# Patient Record
Sex: Male | Born: 1980 | Race: White | Hispanic: No | Marital: Married | Smoking: Former smoker
Health system: Southern US, Community
[De-identification: ages and names within clinical notes are randomized; demographics above are authoritative.]

## PROBLEM LIST (undated history)

## (undated) DIAGNOSIS — K219 Gastro-esophageal reflux disease without esophagitis: Secondary | ICD-10-CM

## (undated) DIAGNOSIS — Z87442 Personal history of urinary calculi: Secondary | ICD-10-CM

## (undated) DIAGNOSIS — F419 Anxiety disorder, unspecified: Secondary | ICD-10-CM

## (undated) DIAGNOSIS — I10 Essential (primary) hypertension: Secondary | ICD-10-CM

## (undated) DIAGNOSIS — M1990 Unspecified osteoarthritis, unspecified site: Secondary | ICD-10-CM

## (undated) DIAGNOSIS — N209 Urinary calculus, unspecified: Secondary | ICD-10-CM

## (undated) DIAGNOSIS — M797 Fibromyalgia: Secondary | ICD-10-CM

## (undated) HISTORY — PX: ROTATOR CUFF REPAIR: SHX139

## (undated) HISTORY — PX: ADENOIDECTOMY: SUR15

## (undated) HISTORY — PX: TONSILLECTOMY: SUR1361

## (undated) HISTORY — PX: SPINAL CORD STIMULATOR IMPLANT: SHX2422

## (undated) HISTORY — PX: BACK SURGERY: SHX140

## (undated) HISTORY — PX: WISDOM TOOTH EXTRACTION: SHX21

---
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
## 2005-07-23 HISTORY — PX: URETEROSCOPY: SHX842

## 2009-07-23 HISTORY — PX: CARPAL TUNNEL RELEASE: SHX101

## 2011-11-14 ENCOUNTER — Other Ambulatory Visit (HOSPITAL_COMMUNITY): Payer: Self-pay | Admitting: Otolaryngology

## 2011-11-15 ENCOUNTER — Encounter (HOSPITAL_COMMUNITY): Payer: Self-pay | Admitting: Pharmacy Technician

## 2011-11-20 ENCOUNTER — Encounter (HOSPITAL_COMMUNITY)
Admission: RE | Admit: 2011-11-20 | Discharge: 2011-11-20 | Disposition: A | Discharge status: Home / Self Care | Payer: Medicaid Other | Source: Ambulatory Visit | Attending: Otolaryngology | Admitting: Otolaryngology

## 2011-11-20 ENCOUNTER — Encounter (HOSPITAL_COMMUNITY): Payer: Self-pay

## 2011-11-20 ENCOUNTER — Encounter (HOSPITAL_COMMUNITY)
Admission: RE | Admit: 2011-11-20 | Discharge: 2011-11-20 | Disposition: A | Discharge status: Home / Self Care | Payer: Medicaid Other | Source: Ambulatory Visit | Attending: Anesthesiology | Admitting: Anesthesiology

## 2011-11-20 HISTORY — DX: Urinary calculus, unspecified: N20.9

## 2011-11-20 HISTORY — DX: Anxiety disorder, unspecified: F41.9

## 2011-11-20 HISTORY — DX: Unspecified osteoarthritis, unspecified site: M19.90

## 2011-11-20 HISTORY — DX: Gastro-esophageal reflux disease without esophagitis: K21.9

## 2011-11-20 LAB — CBC
HCT: 43.7 % (ref 39.0–52.0)
MCHC: 34.3 g/dL (ref 30.0–36.0)
MCV: 96.7 fL (ref 78.0–100.0)
RDW: 12.5 % (ref 11.5–15.5)

## 2011-11-20 LAB — BASIC METABOLIC PANEL
BUN: 16 mg/dL (ref 6–23)
CO2: 25 mEq/L (ref 19–32)
Calcium: 9.1 mg/dL (ref 8.4–10.5)
Chloride: 101 mEq/L (ref 96–112)
Creatinine, Ser: 0.81 mg/dL (ref 0.50–1.35)

## 2011-11-20 NOTE — Pre-Procedure Instructions (Addendum)
20 REX OESTERLE  11/20/2011   Your procedure is scheduled on:  11/21/11  Report to [facility] a  930* AM.  Call this number if you have problems the morning of surgery: [phone]   Remember:   Do not eat food:After Midnight.  May have clear liquids: up to 4 Hours before arrival.530 am  Clear liquids include soda, tea, black coffee, apple or grape juice, broth.  Take these medicines the morning of surgery with A SIP OF WATER: pain med, allegra  No aspirin today  NO INSULIN, ASPIRIN AM OF SURGERY   Do not wear jewelry, make-up or nail polish.  Do not wear lotions, powders, or perfumes. You may wear deodorant.  Do not shave 48 hours prior to surgery.  Do not bring valuables to the hospital.  Contacts, dentures or bridgework may not be worn into surgery.  Leave suitcase in the car. After surgery it may be brought to your room.  For patients admitted to the hospital, checkout time is 11:00 AM the day of discharge.   Patients discharged the day of surgery will not be allowed to drive home.  Name and phone number of your driver:melissa [phone]  Special Instructions: CHG Shower Use Special Wash: 1/2 bottle night before surgery and 1/2 bottle morning of surgery.   Please read over the following fact sheets that you were given: Pain Booklet, Coughing and Deep Breathing and Surgical Site Infection Prevention

## 2011-11-21 ENCOUNTER — Ambulatory Visit (HOSPITAL_COMMUNITY)
Admission: RE | Admit: 2011-11-21 | Discharge: 2011-11-21 | Disposition: A | Discharge status: Home / Self Care | Payer: Medicaid Other | Source: Ambulatory Visit | Attending: Otolaryngology | Admitting: Otolaryngology

## 2011-11-21 ENCOUNTER — Ambulatory Visit (HOSPITAL_COMMUNITY): Payer: Medicaid Other | Admitting: Certified Registered Nurse Anesthetist

## 2011-11-21 ENCOUNTER — Encounter (HOSPITAL_COMMUNITY): Payer: Self-pay | Admitting: Certified Registered Nurse Anesthetist

## 2011-11-21 ENCOUNTER — Encounter (HOSPITAL_COMMUNITY)
Admission: RE | Disposition: A | Discharge status: Home / Self Care | Payer: Self-pay | Source: Ambulatory Visit | Attending: Otolaryngology

## 2011-11-21 ENCOUNTER — Encounter (HOSPITAL_COMMUNITY): Payer: Self-pay | Admitting: *Deleted

## 2011-11-21 DIAGNOSIS — Z01812 Encounter for preprocedural laboratory examination: Secondary | ICD-10-CM | POA: Insufficient documentation

## 2011-11-21 DIAGNOSIS — E119 Type 2 diabetes mellitus without complications: Secondary | ICD-10-CM | POA: Insufficient documentation

## 2011-11-21 DIAGNOSIS — Z5309 Procedure and treatment not carried out because of other contraindication: Secondary | ICD-10-CM | POA: Insufficient documentation

## 2011-11-21 DIAGNOSIS — Z9641 Presence of insulin pump (external) (internal): Secondary | ICD-10-CM | POA: Insufficient documentation

## 2011-11-21 DIAGNOSIS — J45909 Unspecified asthma, uncomplicated: Secondary | ICD-10-CM | POA: Insufficient documentation

## 2011-11-21 DIAGNOSIS — K219 Gastro-esophageal reflux disease without esophagitis: Secondary | ICD-10-CM | POA: Insufficient documentation

## 2011-11-21 DIAGNOSIS — F411 Generalized anxiety disorder: Secondary | ICD-10-CM | POA: Insufficient documentation

## 2011-11-21 DIAGNOSIS — J383 Other diseases of vocal cords: Secondary | ICD-10-CM | POA: Insufficient documentation

## 2011-11-21 DIAGNOSIS — Z794 Long term (current) use of insulin: Secondary | ICD-10-CM | POA: Insufficient documentation

## 2011-11-21 HISTORY — PX: DIRECT LARYNGOSCOPY: SHX5326

## 2011-11-21 LAB — GLUCOSE, CAPILLARY: Glucose-Capillary: 209 mg/dL — ABNORMAL HIGH (ref 70–99)

## 2011-11-21 SURGERY — LARYNGOSCOPY, DIRECT
Anesthesia: General | Site: Mouth | Laterality: Right | Wound class: Clean Contaminated

## 2011-11-21 MED ORDER — CEFAZOLIN SODIUM-DEXTROSE 2-3 GM-% IV SOLR
2.0000 g | INTRAVENOUS | Status: AC
  Administered 2011-11-21: 2 g via INTRAVENOUS
  Filled 2011-11-21: qty 50

## 2011-11-21 MED ORDER — SUCCINYLCHOLINE CHLORIDE 20 MG/ML IJ SOLN
INTRAMUSCULAR | Status: DC | PRN
  Administered 2011-11-21: 100 mg via INTRAVENOUS

## 2011-11-21 MED ORDER — DROPERIDOL 2.5 MG/ML IJ SOLN: 0.6250 mg | INTRAMUSCULAR | Status: DC | PRN

## 2011-11-21 MED ORDER — ONDANSETRON HCL 4 MG/2ML IJ SOLN
INTRAMUSCULAR | Status: DC | PRN
  Administered 2011-11-21: 4 mg via INTRAVENOUS

## 2011-11-21 MED ORDER — LIDOCAINE HCL (CARDIAC) 20 MG/ML IV SOLN
INTRAVENOUS | Status: DC | PRN
  Administered 2011-11-21: 20 mg via INTRAVENOUS

## 2011-11-21 MED ORDER — PROPOFOL 10 MG/ML IV EMUL
INTRAVENOUS | Status: DC | PRN
  Administered 2011-11-21: 200 mg via INTRAVENOUS
  Administered 2011-11-21: 40 mg via INTRAVENOUS

## 2011-11-21 MED ORDER — HYDROMORPHONE HCL PF 1 MG/ML IJ SOLN
0.2500 mg | INTRAMUSCULAR | Status: DC | PRN
  Administered 2011-11-21: 0.5 mg via INTRAVENOUS
  Administered 2011-11-21 (×2): 0.25 mg via INTRAVENOUS

## 2011-11-21 MED ORDER — LACTATED RINGERS IV SOLN
INTRAVENOUS | Status: DC
  Administered 2011-11-21: 11:00:00 via INTRAVENOUS

## 2011-11-21 MED ORDER — LACTATED RINGERS IV SOLN
INTRAVENOUS | Status: DC | PRN
  Administered 2011-11-21: 11:00:00 via INTRAVENOUS

## 2011-11-21 MED ORDER — DEXAMETHASONE SODIUM PHOSPHATE 4 MG/ML IJ SOLN
INTRAMUSCULAR | Status: DC | PRN
  Administered 2011-11-21: 4 mg via INTRAVENOUS

## 2011-11-21 MED ORDER — MIDAZOLAM HCL 5 MG/5ML IJ SOLN
INTRAMUSCULAR | Status: DC | PRN
  Administered 2011-11-21: 2 mg via INTRAVENOUS

## 2011-11-21 MED ORDER — FENTANYL CITRATE 0.05 MG/ML IJ SOLN
INTRAMUSCULAR | Status: DC | PRN
  Administered 2011-11-21: 50 ug via INTRAVENOUS
  Administered 2011-11-21: 100 ug via INTRAVENOUS

## 2011-11-21 SURGICAL SUPPLY — 20 items
BALLN PULM 15 16.5 18X75 (BALLOONS)
BALLOON PULM 15 16.5 18X75 (BALLOONS) IMPLANT
CANISTER SUCTION 2500CC (MISCELLANEOUS) ×2 IMPLANT
CONT SPEC 4OZ CLIKSEAL STRL BL (MISCELLANEOUS) IMPLANT
COVER TABLE BACK 60X90 (DRAPES) ×2 IMPLANT
DRAPE PROXIMA HALF (DRAPES) ×2 IMPLANT
GLOVE SURG SS PI 7.5 STRL IVOR (GLOVE) ×2 IMPLANT
GOWN STRL NON-REIN LRG LVL3 (GOWN DISPOSABLE) IMPLANT
GUARD TEETH (MISCELLANEOUS) ×2 IMPLANT
MARKER SKIN DUAL TIP RULER LAB (MISCELLANEOUS) ×2 IMPLANT
NS IRRIG 1000ML POUR BTL (IV SOLUTION) ×2 IMPLANT
PAD ARMBOARD 7.5X6 YLW CONV (MISCELLANEOUS) ×4 IMPLANT
PATTIES SURGICAL .5 X1 (DISPOSABLE) IMPLANT
SOLUTION ANTI FOG 6CC (MISCELLANEOUS) ×2 IMPLANT
SPONGE GAUZE 4X4 12PLY (GAUZE/BANDAGES/DRESSINGS) ×2 IMPLANT
SURGILUBE 2OZ TUBE FLIPTOP (MISCELLANEOUS) IMPLANT
SYR INFLATE BILIARY GAUGE (MISCELLANEOUS) IMPLANT
TOWEL OR 17X24 6PK STRL BLUE (TOWEL DISPOSABLE) ×4 IMPLANT
TUBE CONNECTING 12X1/4 (SUCTIONS) ×2 IMPLANT
WATER STERILE IRR 1000ML POUR (IV SOLUTION) ×2 IMPLANT

## 2011-11-21 NOTE — Anesthesia Preprocedure Evaluation (Signed)
Anesthesia Evaluation  Patient identified by MRN, date of birth, ID band Patient awake    Reviewed: Allergy & Precautions, H&P , NPO status , Patient's Chart, lab work & pertinent test results  History of Anesthesia Complications Negative for: history of anesthetic complications  Airway Mallampati: II TM Distance: >3 FB Neck ROM: Full    Dental  (+) Teeth Intact and Dental Advisory Given   Pulmonary asthma ,  breath sounds clear to auscultation  Pulmonary exam normal       Cardiovascular Rhythm:Regular Rate:Normal     Neuro/Psych Anxiety    GI/Hepatic Neg liver ROS, GERD-  ,  Endo/Other  Diabetes mellitus-, Type 1, Insulin Dependent  Renal/GU negative Renal ROS     Musculoskeletal   Abdominal   Peds  Hematology   Anesthesia Other Findings   Reproductive/Obstetrics                           Anesthesia Physical Anesthesia Plan  ASA: III  Anesthesia Plan: General   Post-op Pain Management:    Induction: Intravenous  Airway Management Planned: Oral ETT  Additional Equipment:   Intra-op Plan:   Post-operative Plan:   Informed Consent: I have reviewed the patients History and Physical, chart, labs and discussed the procedure including the risks, benefits and alternatives for the proposed anesthesia with the patient or authorized representative who has indicated his/her understanding and acceptance.   Dental advisory given  Plan Discussed with: CRNA, Anesthesiologist and Surgeon  Anesthesia Plan Comments:         Anesthesia Quick Evaluation

## 2011-11-21 NOTE — Op Note (Signed)
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 After discussing the risks, benefits, and alternatives he was taken today for planned direct laryngoscopy with biopsy of the lesion. DESCRIPTION OF OPERATION: The patient was brought to the operating room and was placed in the supine position and was placed under general endotracheal anesthesia by anesthesiology. This required two attempts and a significant amount of cricoid pressure due to a deep/anterior larynx and he was a grade II view with cricoid pressure.  Direct laryngoscopy was performed first using the Dedo laryngoscope. I placed a tooth guard to protect the maxillary dentition. He was a grade II/III view with only the arytenoids visible and I could not identify the anterior commissure or the glottic aperture. I then attempted direct laryngoscopy using the anterior commissure laryngoscope. He was again a grade II/III view with only the arytenoids identified and I could not identify the anterior commissure or the glottic aperture even with the anterior commissure scope and zero degree hopkins rod. As the right anterior commissure lesion could not be visualized and could not be biopsied, the case was aborted.  The patient was turned back to anesthesia and awakened from anesthesia and extubated without difficulty. The patient tolerated the procedure well with no immediate complications and was taken to the postoperative recovery area in good condition. We will plan on a referral to Laryngology  at [facility] for possible laryngoscopy with biopsy.   Dr. Melvenia Beam was present and performed the entire procedure. 11/21/11 12:17 PM Melvenia Beam

## 2011-11-21 NOTE — Discharge Instructions (Signed)
Laryngoscopy A laryngoscopy is a procedure performed to view the back of the throat, vocal cords, and voice box (larynx). It may be done in order to figure out why a person has:  A cough.   Voice changes, such as new hoarseness.   Throat pain.   Problems swallowing.  During a laryngoscopy, tissue samples (biopsies) can be taken or foreign bodies can be removed. A laryngoscopy can be done in the caregiver's office. It may be performed with a hand-held mirror, or it may require the use of a fiberoptic scope. Under some circumstances, it may be done in a hospital with medicine to help you sleep (general anesthesia). LET YOUR CAREGIVER KNOW ABOUT:   Allergies.   Medicines taken, including herbs, eyedrops, over-the-counter medicines, and creams.   Use of steroids (by mouth or creams).   Previous problems with anesthetics or numbing medicines.   History of bleeding or blood problems.   History of blood clots.   Possibility of pregnancy, if this applies.   Previous surgery.   Other health problems.  RISKS AND COMPLICATIONS   Pain.   Gagging.   Vomiting.   Swelling.   Bleeding.   Problems from anesthesia.  BEFORE THE PROCEDURE  Several days before the procedure, you may have blood tests to make sure the blood clots normally. You may be asked to stop taking blood thinners, aspirin, and/or nonsteroidal anti-inflammatory drugs (NSAIDs) before the procedure. Do not give aspirin to children. If general anesthesia is going to be used, you will usually be asked to stop eating and drinking at least 8 hours before the procedure. Have someone go with you to the procedure in order to drive you home afterwards. PROCEDURE  If you are having the procedure in the caregiver's office, it is usually performed while sitting up in a special exam chair with a headrest. A numbing medicine will be sprayed into the mouth and on the back of the throat. Your caregiver will use a gauze pad to hold the  tongue out of the way. A mirror will be held at the back of the throat to allow your caregiver to see down the throat. You may be asked to make certain sounds so that vocal cord movement can be observed. If a fiberoptic scope is being used, it will be inserted into either the nose or the mouth and slipped into the throat. Your caregiver can look through an eyepiece or can see an image projected on a monitor. Pieces of tissue can be taken (biopsies) or foreign bodies removed. If you need to have the procedure performed under general anesthesia, you will be lying down on a special operating table. The same kinds of procedures will be followed, but you will not be aware of them. AFTER THE PROCEDURE   When laryngoscopy is done with only local numbing, it usually does not require any changes to your activity level after the procedure is complete.   You may have a sore throat.   You may be asked to rest the voice for some length of time after the procedure.   Do not smoke.   Follow your caregiver's directions regarding eating and drinking after the procedure.   If you had a biopsy taken, your caregiver may advise trying to avoid coughing, whispering, or clearing the throat.   If you were given a general anesthetic or a medicine to help you relax (sedative), you will be sleepy.   There may be some pain or you may feel sick   to your stomach (nauseous), but this can usually be controlled with medicines taken by mouth.   You will stay in the recovery room until awake and able to drink fluids.   You can go back to his or her usual level of activity within several days.  HOME CARE INSTRUCTIONS   Take all medicines exactly as directed.   Follow any prescribed diet.   Follow instructions regarding rest (including voice rest) and physical activity.   Follow instructions for the use of throat lozenges or gargles.  SEEK IMMEDIATE MEDICAL CARE IF:   You have severe pain.   You have new bleeding  during coughing, spitting, or vomiting.   You develop nausea and vomiting.   You develop new problems with swallowing.   You have difficulty breathing or have shortness of breath.   You have chest pain.   Your voice changes.   You have a fever.   You develop a cough.  MAKE SURE YOU:   Understand these instructions.   Will watch your condition.   Will get help right away if you are not doing well or get worse.  Document Released: 10/03/2009 Document Revised: 06/28/2011 Document Reviewed: 10/03/2009 ExitCare Patient Information 2012 [employer]. 

## 2011-11-21 NOTE — Preoperative (Signed)
Beta Blockers   Reason not to administer Beta Blockers:Not Applicable 

## 2011-11-21 NOTE — Progress Notes (Addendum)
Dr singer notified [facility] 304.pt bolused 5 units via insulin pump per dr singer.pump set at basal rate.

## 2011-11-21 NOTE — Transfer of Care (Signed)
Immediate Anesthesia Transfer of Care Note  Patient: Donald Wall  Procedure(s) Performed: Procedure(s) (LRB): DIRECT LARYNGOSCOPY (Right)  Patient Location: PACU  Anesthesia Type: General  Level of Consciousness: awake, alert , oriented and patient cooperative  Airway & Oxygen Therapy: Patient Spontanous Breathing and Patient connected to nasal cannula oxygen  Post-op Assessment: Report given to PACU RN  Post vital signs: Reviewed and stable  Complications: No apparent anesthesia complications

## 2011-11-21 NOTE — H&P (Signed)
11/21/11  Donald Wall  PREOPERATIVE HISTORY AND PHYSICAL  CHIEF COMPLAINT: right vocal fold mass  HISTORY: This is a 31 year old who presents with _hoarseness and a right vocal fold mass.  He now presents for direct laryngoscopy with biopsy.  Dr. Emeline Darling, [place] has discussed the risks, benefits, and alternatives of this procedure. The patient understands the risks and would like to proceed with the procedure. The chances of success of the procedure are >50% and the patient understands this. I personally performed an examination of the patient within 24 hours of the procedure.  PAST MEDICAL HISTORY: Past Medical History  Diagnosis Date  . Asthma     problem for one year  09  . Stones in the urinary tract   . GERD (gastroesophageal reflux disease)     no med  . Arthritis     rheumatoid  . Anxiety   . Diabetes mellitus     insulin pump    PAST SURGICAL HISTORY: Past Surgical History  Procedure Date  . Tonsillectomy   . Ureteroscopy 07    x2 08 with litho  . Carpal tunnel release 11    rt    MEDICATIONS: No current facility-administered medications on file prior to encounter.   Current Outpatient Prescriptions on File Prior to Encounter  Medication Sig Dispense Refill  . atorvastatin (LIPITOR) 40 MG tablet Take 40 mg by mouth daily.      Marland Kitchen etanercept (ENBREL) 50 MG/ML injection Inject 50 mg into the skin once a week.      . fexofenadine (ALLEGRA) 180 MG tablet Take 180 mg by mouth daily.      . furosemide (LASIX) 20 MG tablet Take 20 mg by mouth daily.      . Insulin Human (INSULIN PUMP) 100 unit/ml SOLN Inject into the skin continuous. novolog insulin      . lisinopril (PRINIVIL,ZESTRIL) 20 MG tablet Take 20 mg by mouth daily.      . niacin (NIASPAN) 1000 MG CR tablet Take 2,000 mg by mouth daily.        ALLERGIES: No Known Allergies  SOCIAL HISTORY: History   Social History  . Marital Status: Married    Spouse Name: N/A    Number of Children: N/A  . Years of  Education: N/A   Occupational History  . Not on file.   Social History Main Topics  . Smoking status: Former Smoker -- 1.5 packs/day for 4 years    Types: Cigarettes    Quit date: 10/20/2010  . Smokeless tobacco: Not on file  . Alcohol Use: 4.2 oz/week    7 Shots of liquor per week  . Drug Use: No  . Sexually Active: Yes   Other Topics Concern  . Not on file   Social History Narrative  . No narrative on file    FAMILY HISTORY:History reviewed. No pertinent family history.  REVIEW OF SYSTEMS: + hoarseness, otherwise negative x 10 systems except per HPI   PHYSICAL EXAM:  GENERAL:  NAD VITAL SIGNS:   Filed Vitals:   11/21/11 0925  BP: 119/85  Pulse: 73  Temp: 98.5 F (36.9 C)  Resp: 18   SKIN:  Warm, dry HEENT:  Hoarse voice, oral cavity clear NECK:  supple LYMPH:  No LAD LUNGS:  clear CARDIOVASCULAR:  RRR ABDOMEN:  soft MUSCULOSKELETAL: normal strength PSYCH:  Awake, alert NEUROLOGIC:  CN 2-12 intact and symmetric  DIAGNOSTIC STUDIES: none  ASSESSMENT AND PLAN: Plan to proceed with direct laryngoscopy with biopsy of  the right vocal fold mass. Patient understands the risks, benefits, and alternatives.  11/21/11 10:32 AM Donald Wall

## 2011-11-21 NOTE — Anesthesia Postprocedure Evaluation (Signed)
  Anesthesia Post-op Note  Patient: Donald Wall  Procedure(s) Performed: Procedure(s) (LRB): DIRECT LARYNGOSCOPY (Right)  Patient Location: PACU  Anesthesia Type: General  Level of Consciousness: awake, alert  and oriented  Airway and Oxygen Therapy: Patient Spontanous Breathing  Post-op Pain: mild  Post-op Assessment: Post-op Vital signs reviewed, Patient's Cardiovascular Status Stable, Respiratory Function Stable, Patent Airway, No signs of Nausea or vomiting and Pain level controlled  Post-op Vital Signs: Reviewed and stable  Complications: No apparent anesthesia complications

## 2011-11-22 ENCOUNTER — Encounter (HOSPITAL_COMMUNITY): Payer: Self-pay | Admitting: Otolaryngology

## 2011-12-04 DIAGNOSIS — J383 Other diseases of vocal cords: Secondary | ICD-10-CM | POA: Insufficient documentation

## 2011-12-04 DIAGNOSIS — R490 Dysphonia: Secondary | ICD-10-CM | POA: Insufficient documentation

## 2011-12-21 DIAGNOSIS — E785 Hyperlipidemia, unspecified: Secondary | ICD-10-CM | POA: Insufficient documentation

## 2011-12-21 DIAGNOSIS — I10 Essential (primary) hypertension: Secondary | ICD-10-CM | POA: Insufficient documentation

## 2015-06-07 ENCOUNTER — Encounter: Payer: 59 | Attending: Physician Assistant | Admitting: *Deleted

## 2015-06-07 DIAGNOSIS — E109 Type 1 diabetes mellitus without complications: Secondary | ICD-10-CM

## 2015-06-11 ENCOUNTER — Encounter: Payer: Self-pay | Admitting: *Deleted

## 2015-06-11 NOTE — Progress Notes (Signed)
Pump Upgrade Training: Appt start time: 1530 end time: 1600.   Assessment: Primary concerns today: Patient here for upgrade to Medtronic 630G insulin pump  insulin pump.   Medications: see list. Insulin used in pump is Novolog  Intervention:  Pump settings transferred directly from current pump to new pump by patient New meter ID added to pump for communicating Reviewed new features of pump with patient Suggested use of Temp Basal and Extended Bolus features to patient  Patient is not signed up on [employer]. I suggested he sign up if he would like me to assist in pump setting management Patient expressed understanding of info taught and is wearing pump by end of visit.  Follow Up  Patient to see MD for insulin dose adjustments   CGM Training:  Appt start time: 1600 end time:1700.  Assessment:  Primary concerns today: Patient here for initiation of Medtronic Continuous Glucose Monitoring.      Intervention:   Understanding Glucose Sensing Programming Sensor Information  High Glucose: 300 mg/dl  Low Glucose 90 mg/dl  Other settings to be added at follow up visit Starting [employer] of Product  Entering BG, Calibration Technique and Graphs with Public librarian and Alarms  Follow Up Patient to see MD for insulin dose adjustments and to schedule visit with me for CGM follow up within 2-6 week(s). Informed of DM 1 Support Group for ongoing education and support

## 2016-12-26 ENCOUNTER — Encounter: Payer: Self-pay | Admitting: Neurology

## 2016-12-26 ENCOUNTER — Ambulatory Visit (INDEPENDENT_AMBULATORY_CARE_PROVIDER_SITE_OTHER): Payer: Self-pay | Admitting: Neurology

## 2016-12-26 VITALS — BP 118/81 | HR 84 | Ht 69.0 in | Wt 189.0 lb

## 2016-12-26 DIAGNOSIS — G4713 Recurrent hypersomnia: Secondary | ICD-10-CM

## 2016-12-26 NOTE — Progress Notes (Signed)
SLEEP MEDICINE CLINIC   Provider:  Melvyn Novas, M D  Primary Care Physician:  Elfredia Nevins, MD   Referring Provider: Shawnie Dapper, PA-C     HPI:  Donald Wall is a 36 y.o. male , seen here as in a referral/ revisit  from [place].  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 He underwent a home sleep test on May 29 after recording 3 nights of sleep, he has not received the results. Therefore I don't have the results either. It makes no sense for me to evaluate a patient for narcolepsy if I don't know if the much more common sleep disorder of obstructive sleep apnea is present or not. The patient should visit with me after he has received the results and if the home sleep test was negative for apnea. Only then doesn't make sense to evaluate him for narcolepsy.  Chief complaint according to patient : The patient reports sudden sleepiness attacks without proceeding for periods of fatigue or warning.   Sleep habits are as follows: Will be evaluated when the patient is coming for his sleep consult.  Sleep medical history and family sleep history:    Social history:  He is a Games developer,   Social History   Social History  . Marital status: Legally Separated    Spouse name: N/A  . Number of children: N/A  . Years of education: N/A   Occupational History  . Not on file.   Social History Main Topics  . Smoking status: Former Smoker    Packs/day: 1.50    Years: 4.00    Types: Cigarettes    Quit date: 10/20/2010  . Smokeless tobacco: Not on file  . Alcohol use 4.2 oz/week    7 Shots of liquor per week  . Drug use: No  . Sexual activity: Yes   Other Topics Concern  . Not on file   Social History Narrative  . No narrative on file    No family history on file.  Past Medical  History:  Diagnosis Date  . Anxiety   . Arthritis    rheumatoid  . Asthma    problem for one year  09  . Diabetes mellitus    insulin pump  . GERD (gastroesophageal reflux disease)    no med  . 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 ADENOIDECTOMY    . CARPAL TUNNEL RELEASE  11   rt  . DIRECT LARYNGOSCOPY  11/21/2011   Procedure: DIRECT LARYNGOSCOPY;  Surgeon: Melvenia Beam, MD;  Location: [facility] OR;  Service: ENT;  Laterality: Right;  Direct Laryngoscopy with Biopsy  . TONSILLECTOMY    . URETEROSCOPY  07   x2 08 with litho    Current Outpatient Prescriptions  Medication Sig Dispense Refill  . ALPRAZolam (XANAX) 0.5 MG tablet Take 0.5 mg by mouth 3 (three) times daily with meals as needed.    Marland Kitchen aspirin 325 MG tablet Take 325 mg by mouth daily.    Marland Kitchen atorvastatin (LIPITOR) 40 MG tablet Take 40 mg by mouth daily.    . busPIRone (BUSPAR) 10 MG tablet Take 10 mg by mouth 2 (two) times daily.    Marland Kitchen etanercept (ENBREL) 50 MG/ML injection Inject 50  mg into the skin once a week.    . fexofenadine (ALLEGRA) 180 MG tablet Take 180 mg by mouth daily.    . folic acid (FOLVITE) 1 MG tablet Take 1 mg by mouth daily.    . furosemide (LASIX) 20 MG tablet Take 20 mg by mouth daily.    Marland Kitchen HYDROcodone-acetaminophen (NORCO) 5-325 MG per tablet Take 1 tablet by mouth every 4 (four) hours as needed. For pain    . Insulin Human (INSULIN PUMP) 100 unit/ml SOLN Inject into the skin continuous. novolog insulin    . lisinopril (PRINIVIL,ZESTRIL) 20 MG tablet Take 20 mg by mouth daily.    . methotrexate 25 MG/ML SOLN Inject 25 mg into the muscle once a week. Upper thigh injection (IM)    . niacin (NIASPAN) 1000 MG CR tablet Take 2,000 mg by mouth daily.    Marland Kitchen omeprazole (PRILOSEC) 20 MG capsule Take 20 mg by mouth daily.    . polyethylene glycol (MIRALAX / GLYCOLAX) packet Take 17 g by mouth daily.    . Pramlintide Acetate (SYMLINPEN 60) 1500 MCG/1.5ML SOPN Inject  into the skin 3 (three) times daily before meals.     No current facility-administered medications for this visit.     Allergies as of 12/26/2016  . (No Known Allergies)    Vitals: There were no vitals taken for this visit. Last Weight:  Wt Readings from Last 1 Encounters:  11/20/11 228 lb 12.8 oz (103.8 kg)   KMQ:KMMNO is no height or weight on file to calculate BMI.     Last Height:   Ht Readings from Last 1 Encounters:  11/20/11 5\' 9"  (1.753 m)      I will not pill for the patient's consult today, he will receive his co-pay back until we have a proper visit with the preceding results.  , MD 12/26/2016, 3:18 PM  Certified in Neurology by ABPN Certified in Sleep Medicine by [facility] [address] [place], [place]

## 2016-12-28 ENCOUNTER — Telehealth: Payer: Self-pay | Admitting: Neurology

## 2016-12-28 NOTE — Telephone Encounter (Signed)
Error

## 2017-02-01 ENCOUNTER — Encounter: Payer: Self-pay | Admitting: Neurology

## 2017-02-04 ENCOUNTER — Institutional Professional Consult (permissible substitution): Payer: 59 | Admitting: Neurology

## 2017-02-04 ENCOUNTER — Encounter: Payer: Self-pay | Admitting: Neurology

## 2017-02-05 ENCOUNTER — Encounter: Payer: Self-pay | Admitting: Neurology

## 2017-02-06 ENCOUNTER — Institutional Professional Consult (permissible substitution): Payer: 59 | Admitting: Neurology

## 2017-02-07 ENCOUNTER — Encounter: Payer: Self-pay | Admitting: Neurology

## 2017-07-23 HISTORY — PX: SPINAL FUSION: SHX223

## 2017-07-23 HISTORY — PX: ROTATOR CUFF REPAIR: SHX139

## 2017-10-30 LAB — HEMOGLOBIN A1C: Hemoglobin A1C: 7.9

## 2018-01-03 ENCOUNTER — Encounter: Payer: Self-pay | Admitting: Endocrinology

## 2018-01-08 ENCOUNTER — Telehealth (HOSPITAL_COMMUNITY): Payer: Self-pay | Admitting: Cardiology

## 2018-01-08 NOTE — Telephone Encounter (Signed)
Opened in error

## 2018-02-13 ENCOUNTER — Other Ambulatory Visit: Payer: Self-pay

## 2018-02-13 ENCOUNTER — Emergency Department (HOSPITAL_COMMUNITY): Payer: 59

## 2018-02-13 ENCOUNTER — Encounter (HOSPITAL_COMMUNITY): Payer: Self-pay | Admitting: Emergency Medicine

## 2018-02-13 ENCOUNTER — Emergency Department (HOSPITAL_COMMUNITY)
Admission: EM | Admit: 2018-02-13 | Discharge: 2018-02-13 | Disposition: A | Discharge status: Home / Self Care | Payer: 59 | Attending: Emergency Medicine | Admitting: Emergency Medicine

## 2018-02-13 DIAGNOSIS — W19XXXA Unspecified fall, initial encounter: Secondary | ICD-10-CM

## 2018-02-13 DIAGNOSIS — J45909 Unspecified asthma, uncomplicated: Secondary | ICD-10-CM | POA: Diagnosis not present

## 2018-02-13 DIAGNOSIS — E119 Type 2 diabetes mellitus without complications: Secondary | ICD-10-CM | POA: Diagnosis not present

## 2018-02-13 DIAGNOSIS — S39012A Strain of muscle, fascia and tendon of lower back, initial encounter: Secondary | ICD-10-CM | POA: Insufficient documentation

## 2018-02-13 DIAGNOSIS — Y999 Unspecified external cause status: Secondary | ICD-10-CM | POA: Insufficient documentation

## 2018-02-13 DIAGNOSIS — Y9389 Activity, other specified: Secondary | ICD-10-CM | POA: Insufficient documentation

## 2018-02-13 DIAGNOSIS — Y9289 Other specified places as the place of occurrence of the external cause: Secondary | ICD-10-CM | POA: Insufficient documentation

## 2018-02-13 DIAGNOSIS — Z79899 Other long term (current) drug therapy: Secondary | ICD-10-CM | POA: Insufficient documentation

## 2018-02-13 DIAGNOSIS — Z87891 Personal history of nicotine dependence: Secondary | ICD-10-CM | POA: Insufficient documentation

## 2018-02-13 DIAGNOSIS — S3992XA Unspecified injury of lower back, initial encounter: Secondary | ICD-10-CM | POA: Diagnosis present

## 2018-02-13 HISTORY — DX: Fibromyalgia: M79.7

## 2018-02-13 LAB — CBC WITH DIFFERENTIAL/PLATELET
Basophils Absolute: 0 10*3/uL (ref 0.0–0.1)
Basophils Relative: 0 %
EOS ABS: 0.3 10*3/uL (ref 0.0–0.7)
Eosinophils Relative: 3 %
HEMATOCRIT: 36.6 % — AB (ref 39.0–52.0)
HEMOGLOBIN: 12.1 g/dL — AB (ref 13.0–17.0)
LYMPHS ABS: 1.4 10*3/uL (ref 0.7–4.0)
Lymphocytes Relative: 16 %
MCH: 31.4 pg (ref 26.0–34.0)
MCHC: 33.1 g/dL (ref 30.0–36.0)
MCV: 95.1 fL (ref 78.0–100.0)
MONO ABS: 0.6 10*3/uL (ref 0.1–1.0)
MONOS PCT: 7 %
NEUTROS PCT: 74 %
Neutro Abs: 6.3 10*3/uL (ref 1.7–7.7)
Platelets: 197 10*3/uL (ref 150–400)
RBC: 3.85 MIL/uL — ABNORMAL LOW (ref 4.22–5.81)
RDW: 12.7 % (ref 11.5–15.5)
WBC: 8.5 10*3/uL (ref 4.0–10.5)

## 2018-02-13 LAB — COMPREHENSIVE METABOLIC PANEL
ALK PHOS: 84 U/L (ref 38–126)
ALT: 25 U/L (ref 0–44)
ANION GAP: 6 (ref 5–15)
AST: 28 U/L (ref 15–41)
Albumin: 3.5 g/dL (ref 3.5–5.0)
BILIRUBIN TOTAL: 0.4 mg/dL (ref 0.3–1.2)
BUN: 24 mg/dL — ABNORMAL HIGH (ref 6–20)
CO2: 29 mmol/L (ref 22–32)
Calcium: 8.7 mg/dL — ABNORMAL LOW (ref 8.9–10.3)
Chloride: 102 mmol/L (ref 98–111)
Creatinine, Ser: 1.04 mg/dL (ref 0.61–1.24)
GFR calc non Af Amer: >60 mL/min (ref >60)
Glucose, Bld: 364 mg/dL — ABNORMAL HIGH (ref 70–99)
POTASSIUM: 4.4 mmol/L (ref 3.5–5.1)
SODIUM: 137 mmol/L (ref 135–145)
TOTAL PROTEIN: 6.3 g/dL — AB (ref 6.5–8.1)

## 2018-02-13 LAB — ETHANOL: Alcohol, Ethyl (B): <10 mg/dL (ref <10)

## 2018-02-13 LAB — CBG MONITORING, ED: GLUCOSE-CAPILLARY: 364 mg/dL — AB (ref 70–99)

## 2018-02-13 LAB — TYPE AND SCREEN
ABO/RH(D): O POS
ANTIBODY SCREEN: NEGATIVE

## 2018-02-13 MED ORDER — IOPAMIDOL (ISOVUE-300) INJECTION 61%
100.0000 mL | Freq: Once | INTRAVENOUS | Status: AC | PRN
  Administered 2018-02-13: 100 mL via INTRAVENOUS
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
## 2018-02-13 NOTE — Discharge Instructions (Signed)
Your testing today shows that you may have a very small area of 1 of your lumbar bones that has a small compression to it.  This is of really no consequence, you can follow up with the doctor listed above or the doctor of your choice.  You are already taking pain medications thus we cannot prescribe any more pain medicine for you.  Please rest and use ice packs over the area that hurts.  See your doctor in the clinic for ongoing pain though I suspect she will be in pain for several days to a couple of weeks.

## 2018-05-12 ENCOUNTER — Emergency Department (HOSPITAL_COMMUNITY)
Admission: EM | Admit: 2018-05-12 | Discharge: 2018-05-12 | Disposition: A | Discharge status: Home / Self Care | Payer: 59 | Attending: Emergency Medicine | Admitting: Emergency Medicine

## 2018-05-12 ENCOUNTER — Encounter (HOSPITAL_COMMUNITY): Payer: Self-pay | Admitting: Emergency Medicine

## 2018-05-12 DIAGNOSIS — Z794 Long term (current) use of insulin: Secondary | ICD-10-CM | POA: Diagnosis not present

## 2018-05-12 DIAGNOSIS — N179 Acute kidney failure, unspecified: Secondary | ICD-10-CM | POA: Diagnosis not present

## 2018-05-12 DIAGNOSIS — R739 Hyperglycemia, unspecified: Secondary | ICD-10-CM

## 2018-05-12 DIAGNOSIS — E860 Dehydration: Secondary | ICD-10-CM | POA: Diagnosis not present

## 2018-05-12 DIAGNOSIS — E1165 Type 2 diabetes mellitus with hyperglycemia: Secondary | ICD-10-CM | POA: Diagnosis not present

## 2018-05-12 DIAGNOSIS — F17210 Nicotine dependence, cigarettes, uncomplicated: Secondary | ICD-10-CM | POA: Insufficient documentation

## 2018-05-12 DIAGNOSIS — J45909 Unspecified asthma, uncomplicated: Secondary | ICD-10-CM | POA: Diagnosis not present

## 2018-05-12 LAB — BASIC METABOLIC PANEL
ANION GAP: 12 (ref 5–15)
BUN: 50 mg/dL — AB (ref 6–20)
CHLORIDE: 92 mmol/L — AB (ref 98–111)
CO2: 26 mmol/L (ref 22–32)
Calcium: 9.9 mg/dL (ref 8.9–10.3)
Creatinine, Ser: 2.56 mg/dL — ABNORMAL HIGH (ref 0.61–1.24)
GFR, EST AFRICAN AMERICAN: 35 mL/min — AB (ref >60)
GFR, EST NON AFRICAN AMERICAN: 31 mL/min — AB (ref >60)
Glucose, Bld: 460 mg/dL — ABNORMAL HIGH (ref 70–99)
POTASSIUM: 4.2 mmol/L (ref 3.5–5.1)
SODIUM: 130 mmol/L — AB (ref 135–145)

## 2018-05-12 LAB — CBG MONITORING, ED
GLUCOSE-CAPILLARY: 547 mg/dL — AB (ref 70–99)
GLUCOSE-CAPILLARY: 370 mg/dL — AB (ref 70–99)

## 2018-05-12 LAB — CBC
HEMATOCRIT: 40.3 % (ref 39.0–52.0)
HEMOGLOBIN: 13.5 g/dL (ref 13.0–17.0)
MCH: 30 pg (ref 26.0–34.0)
MCHC: 33.5 g/dL (ref 30.0–36.0)
MCV: 89.6 fL (ref 80.0–100.0)
NRBC: 0 % (ref 0.0–0.2)
Platelets: 264 10*3/uL (ref 150–400)
RBC: 4.5 MIL/uL (ref 4.22–5.81)
RDW: 11.9 % (ref 11.5–15.5)
WBC: 6 10*3/uL (ref 4.0–10.5)

## 2018-05-12 LAB — I-STAT CHEM 8, ED
BUN: 43 mg/dL — ABNORMAL HIGH (ref 6–20)
CREATININE: 1.8 mg/dL — AB (ref 0.61–1.24)
Calcium, Ion: 1.14 mmol/L — ABNORMAL LOW (ref 1.15–1.40)
Chloride: 98 mmol/L (ref 98–111)
GLUCOSE: 128 mg/dL — AB (ref 70–99)
HEMATOCRIT: 37 % — AB (ref 39.0–52.0)
HEMOGLOBIN: 12.6 g/dL — AB (ref 13.0–17.0)
Potassium: 3.8 mmol/L (ref 3.5–5.1)
Sodium: 135 mmol/L (ref 135–145)
TCO2: 26 mmol/L (ref 22–32)

## 2018-05-12 LAB — BLOOD GAS, VENOUS
ACID-BASE EXCESS: 2.3 mmol/L — AB (ref 0.0–2.0)
Bicarbonate: 24.5 mmol/L (ref 20.0–28.0)
FIO2: 21
O2 SAT: 42.2 %
PCO2 VEN: 50.7 mmHg (ref 44.0–60.0)
pH, Ven: 7.352 (ref 7.250–7.430)

## 2018-05-12 MED ORDER — SODIUM CHLORIDE 0.9 % IV BOLUS
1000.0000 mL | Freq: Once | INTRAVENOUS | Status: AC
  Administered 2018-05-12: 1000 mL via INTRAVENOUS

## 2018-05-12 MED ORDER — INSULIN ASPART 100 UNIT/ML ~~LOC~~ SOLN
10.0000 [IU] | Freq: Once | SUBCUTANEOUS | Status: AC
  Administered 2018-05-12: 10 [IU] via INTRAVENOUS

## 2018-05-12 MED ORDER — LACTATED RINGERS IV BOLUS
1000.0000 mL | Freq: Once | INTRAVENOUS | Status: AC
  Administered 2018-05-12: 1000 mL via INTRAVENOUS

## 2018-05-12 MED ORDER — INSULIN GLARGINE 100 UNIT/ML ~~LOC~~ SOLN: 24.0000 [IU] | Freq: Every day | SUBCUTANEOUS | 11 refills | Status: DC

## 2018-05-12 NOTE — ED Triage Notes (Signed)
Pt states he is having trouble with his insulin and his glucose is >600.  States his insulin pump ran out and it was his last infusion set and he has no backup plan due to long acting insulin not being covered by insurance.

## 2018-05-12 NOTE — ED Provider Notes (Signed)
Emergency Department Provider Note   I have reviewed the triage vital signs and the nursing notes.   HISTORY  Chief Complaint Hyperglycemia   HPI Donald Wall is a 37 y.o. male with a history of Diabetes who has had financial trouble and has poor insurance and thus has not had any insulin for approximately 24 to 36 hours.  He used to be on a pump but ran out of supplies and has been trying to stretch the hematochezia endocrinologist in December.  Patient states that he feels lightheaded when he moves around has been urinating a lot but is unsure if he is in DKA.  No polydipsia.  No trauma to his head.  Did fall but hit his elbow and knee.  No other complaints. No other associated or modifying symptoms.    Past Medical History:  Diagnosis Date  . Anxiety   . Arthritis    rheumatoid  . Asthma    problem for one year  09  . Diabetes mellitus    insulin pump  . Fibromyalgia   . GERD (gastroesophageal reflux disease)    no med  . Stones in the urinary tract     There are no active problems to display for this patient.   Past Surgical History:  Procedure Laterality Date  . ADENOIDECTOMY    . CARPAL TUNNEL RELEASE  11   rt  . DIRECT LARYNGOSCOPY  11/21/2011   Procedure: DIRECT LARYNGOSCOPY;  Surgeon: Melvenia Beam, MD;  Location: [facility] OR;  Service: ENT;  Laterality: Right;  Direct Laryngoscopy with Biopsy  . TONSILLECTOMY    . URETEROSCOPY  07   x2 08 with litho    Current Outpatient Rx  . Order #: [id number] Class: Historical Med  . Order #: [id number] Class: Historical Med  . Order #: [id number] Class: Historical Med  . Order #: [id number] Class: Historical Med  . Order #: [id number] Class: Historical Med  . Order #: [id number] Class: Historical Med  . Order #: [id number] Class: Historical Med  . Order #: [id number] Class: Historical Med  . Order #: [id number] Class: Historical Med  . Order #: [id number] Class: Historical Med  . Order #: [id number] Class: Historical Med  . Order #:  [id number] Class: Historical Med  . Order #: [id number] Class: Print    Allergies Patient has no known allergies.  History reviewed. No pertinent family history.  Social History Social History   Tobacco Use  . Smoking status: Former Smoker    Packs/day: 1.50    Years: 4.00    Pack years: 6.00    Types: Cigarettes    Last attempt to quit: 10/20/2010    Years since quitting: 7.5  . Smokeless tobacco: Never Used  Substance Use Topics  . Alcohol use: Yes    Alcohol/week: 7.0 standard drinks    Types: 7 Shots of liquor per week  . Drug use: No    Review of Systems  All other systems negative except as documented in the HPI. All pertinent positives and negatives as reviewed in the HPI. ____________________________________________   PHYSICAL EXAM:  VITAL SIGNS: ED Triage Vitals  Enc Vitals Group     BP 05/12/18 1829 (!) 84/56     Pulse Rate 05/12/18 1829 (!) 117     Resp 05/12/18 1829 16     Temp 05/12/18 1829 98 F (36.7 C)     Temp Source 05/12/18 1829 Oral     SpO2 05/12/18 1829 100 %     Weight 05/12/18 1830 170 lb (77.1 kg)  Height 05/12/18 1830 5\' 9"  (1.753 m)    Constitutional: Alert and oriented. Well appearing and in no acute distress. Eyes: Conjunctivae are normal. PERRL. EOMI. Head: Atraumatic. Nose: No congestion/rhinnorhea. Mouth/Throat: Mucous membranes are dry.  Oropharynx non-erythematous. Neck: No stridor.  No meningeal signs.   Cardiovascular: Normal rate, regular rhythm. Good peripheral circulation. Grossly normal heart sounds.   Respiratory: Normal respiratory effort.  No retractions. Lungs CTAB. Gastrointestinal: Soft and nontender. No distention.  Musculoskeletal: No lower extremity tenderness nor edema. No gross deformities of extremities. Neurologic:  Normal speech and language. No gross focal neurologic deficits are appreciated.  Skin:  Skin is warm, dry and intact. No rash noted.   ____________________________________________    LABS (all labs ordered are listed, but only abnormal results are displayed)  Labs Reviewed  BASIC METABOLIC PANEL - Abnormal; Notable for the following components:      Result Value   Sodium 130 (*)    Chloride 92 (*)    Glucose, Bld 460 (*)    BUN 50 (*)    Creatinine, Ser 2.56 (*)    GFR calc non Af Amer 31 (*)    GFR calc Af Amer 35 (*)    All other components within normal limits  BLOOD GAS, VENOUS - Abnormal; Notable for the following components:   Acid-Base Excess 2.3 (*)    All other components within normal limits  CBG MONITORING, ED - Abnormal; Notable for the following components:   Glucose-Capillary 547 (*)    All other components within normal limits  CBG MONITORING, ED - Abnormal; Notable for the following components:   Glucose-Capillary 370 (*)    All other components within normal limits  I-STAT CHEM 8, ED - Abnormal; Notable for the following components:   BUN 43 (*)    Creatinine, Ser 1.80 (*)    Glucose, Bld 128 (*)    Calcium, Ion 1.14 (*)    Hemoglobin 12.6 (*)    HCT 37.0 (*)    All other components within normal limits  CBC  URINALYSIS, ROUTINE W REFLEX MICROSCOPIC   ____________________________________________  INITIAL IMPRESSION / ASSESSMENT AND PLAN / ED COURSE  Tachycardic, hypotensive, out of insulin. Suspect DKA. Took some lantus prior to arrival so may be in partially treated DKA as well. Will eval/treat as such. For now will give fluids. Pending labs for insulin.   Not in DKA. Likely dehydrated. Does not want to be admitted to hospital so fluids/insuiln and rechecked with improvement in all laboratory tests. Will give Rx for Lantus until he can follow up with his endocrinologist.      Pertinent labs & imaging results that were available during my care of the patient were reviewed by me and considered in my medical decision making (see chart for details).  ____________________________________________  FINAL CLINICAL IMPRESSION(S) / ED  DIAGNOSES  Final diagnoses:  Hyperglycemia  Dehydration  Acute kidney injury (HCC)     MEDICATIONS GIVEN DURING THIS VISIT:  Medications  lactated ringers bolus 1,000 mL (0 mLs Intravenous Stopped 05/12/18 2013)  sodium chloride 0.9 % bolus 1,000 mL (0 mLs Intravenous Stopped 05/12/18 2135)  insulin aspart (novoLOG) injection 10 Units (10 Units Intravenous Given 05/12/18 2103)     NEW OUTPATIENT MEDICATIONS STARTED DURING THIS VISIT:  Discharge Medication List as of 05/12/2018  9:54 PM    START taking these medications   Details  insulin glargine (LANTUS) 100 UNIT/ML injection Inject 0.24 mLs (24 Units total) into the skin daily., Starting Mon  05/12/2018, Print        Note:  This note was prepared with assistance of Dragon voice recognition software. Occasional wrong-word or sound-a-like substitutions may have occurred due to the inherent limitations of voice recognition software.   Marily Memos, MD 05/12/18 2226

## 2018-05-12 NOTE — ED Notes (Signed)
Patient given discharge instruction, verbalized understand. IV removed, band aid applied. Patient ambulatory out of the department.  

## 2018-05-13 NOTE — ED Provider Notes (Signed)
lantus is not covered by insurance. He/pharmacy is [facility]. I will switch to this as it's the same medicine (insulin glargine) at 24 units as previously described. Called in to [place].   Pricilla Loveless, MD 05/13/18 1024

## 2018-06-10 ENCOUNTER — Other Ambulatory Visit: Payer: Self-pay | Admitting: Neurosurgery

## 2018-06-24 ENCOUNTER — Encounter: Payer: Self-pay | Admitting: "Endocrinology

## 2018-06-24 ENCOUNTER — Ambulatory Visit (INDEPENDENT_AMBULATORY_CARE_PROVIDER_SITE_OTHER): Payer: 59 | Admitting: "Endocrinology

## 2018-06-24 VITALS — BP 129/80 | HR 82 | Ht 69.0 in | Wt 185.0 lb

## 2018-06-24 DIAGNOSIS — E1065 Type 1 diabetes mellitus with hyperglycemia: Secondary | ICD-10-CM | POA: Insufficient documentation

## 2018-06-24 NOTE — Patient Instructions (Signed)
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
## 2018-06-25 LAB — COMPLETE METABOLIC PANEL WITH GFR
AG RATIO: 1.9 (calc) (ref 1.0–2.5)
ALT: 16 U/L (ref 9–46)
AST: 17 U/L (ref 10–40)
Albumin: 4.4 g/dL (ref 3.6–5.1)
Alkaline phosphatase (APISO): 63 U/L (ref 40–115)
BUN/Creatinine Ratio: 29 (calc) — ABNORMAL HIGH (ref 6–22)
BUN: 26 mg/dL — AB (ref 7–25)
CO2: 31 mmol/L (ref 20–32)
Calcium: 9.7 mg/dL (ref 8.6–10.3)
Chloride: 103 mmol/L (ref 98–110)
Creat: 0.89 mg/dL (ref 0.60–1.35)
GFR, EST NON AFRICAN AMERICAN: 110 mL/min/{1.73_m2} (ref >=60)
GFR, Est African American: 127 mL/min/{1.73_m2} (ref >=60)
GLUCOSE: 174 mg/dL — AB (ref 65–139)
Globulin: 2.3 g/dL (calc) (ref 1.9–3.7)
POTASSIUM: 4.5 mmol/L (ref 3.5–5.3)
Sodium: 140 mmol/L (ref 135–146)
TOTAL PROTEIN: 6.7 g/dL (ref 6.1–8.1)
Total Bilirubin: 0.5 mg/dL (ref 0.2–1.2)

## 2018-06-25 LAB — MICROALBUMIN / CREATININE URINE RATIO
CREATININE, URINE: 45 mg/dL (ref 20–320)
MICROALB UR: 0.2 mg/dL
Microalb Creat Ratio: 4 mcg/mg creat (ref <30)

## 2018-06-25 LAB — T4, FREE: Free T4: 0.9 ng/dL (ref 0.8–1.8)

## 2018-06-25 LAB — HEMOGLOBIN A1C
Hgb A1c MFr Bld: 8.3 % of total Hgb — ABNORMAL HIGH (ref <5.7)
Mean Plasma Glucose: 192 (calc)
eAG (mmol/L): 10.6 (calc)

## 2018-06-25 LAB — TSH: TSH: 0.83 mIU/L (ref 0.40–4.50)

## 2018-06-25 LAB — VITAMIN D 25 HYDROXY (VIT D DEFICIENCY, FRACTURES): VIT D 25 HYDROXY: 23 ng/mL — AB (ref 30–100)

## 2018-06-25 NOTE — Pre-Procedure Instructions (Signed)
Donald Wall  06/25/2018      [facility] - [place], [place] - [address]  [place] [address] Phone: [phone] Fax: [phone]    Your procedure is scheduled on Friday December 13th.  Report to [facility] Admitting at 0530 A.M.  Call this number if you have problems the morning of surgery:  [phone]   Remember:  Do not eat or drink after midnight.    Take these medicines the morning of surgery with A SIP OF WATER  alprazolam (XANAX) atorvastatin (LIPITOR) busPIRone (BUSPAR)  fexofenadine (ALLEGRA) HYDROcodone-acetaminophen (NORCO) tiZANidine (ZANAFLEX)  7 days prior to surgery STOP taking any Aspirin(unless otherwise instructed by your surgeon), Aleve, Naproxen, Ibuprofen, Motrin, Advil, Goody's, BC's, all herbal medications, fish oil, and all vitamins   WHAT DO I DO ABOUT MY DIABETES MEDICATION?   Marland Kitchen Do not take oral diabetes medicines (pills) the morning of surgery.  . THE NIGHT BEFORE SURGERY, take 10 units of Insulin Glargine.      . The day of surgery, do not take other diabetes injectables, including Byetta (exenatide), Bydureon (exenatide ER), Victoza (liraglutide), or Trulicity (dulaglutide).  . If your CBG is greater than 220 mg/dL, you may take  of your sliding scale (correction) dose of insulin.   How to Manage Your Diabetes Before and After Surgery  Why is it important to control my blood sugar before and after surgery? . Improving blood sugar levels before and after surgery helps healing and can limit problems. . A way of improving blood sugar control is eating a healthy diet by: o  Eating less sugar and carbohydrates o  Increasing activity/exercise o  Talking with your doctor about reaching your blood sugar goals . High blood sugars (greater than 180 mg/dL) can raise your risk of infections and slow your recovery, so you will need to focus on controlling your diabetes during the weeks before  surgery. . Make sure that the doctor who takes care of your diabetes knows about your planned surgery including the date and location.  How do I manage my blood sugar before surgery? . Check your blood sugar at least 4 times a day, starting 2 days before surgery, to make sure that the level is not too high or low. o Check your blood sugar the morning of your surgery when you wake up and every 2 hours until you get to the Short Stay unit. . If your blood sugar is less than 70 mg/dL, you will need to treat for low blood sugar: o Do not take insulin. o Treat a low blood sugar (less than 70 mg/dL) with  cup of clear juice (cranberry or apple), 4 glucose tablets, OR glucose gel. o Recheck blood sugar in 15 minutes after treatment (to make sure it is greater than 70 mg/dL). If your blood sugar is not greater than 70 mg/dL on recheck, call [phone] for further instructions. . Report your blood sugar to the short stay nurse when you get to Short Stay.  . If you are admitted to the hospital after surgery: o Your blood sugar will be checked by the staff and you will probably be given insulin after surgery (instead of oral diabetes medicines) to make sure you have good blood sugar levels. o The goal for blood sugar control after surgery is 80-180 mg/dL.    Do not wear jewelry.  Do not wear lotions, powders, or colognes, or deodorant.  Men may shave face and  neck.  Do not bring valuables to the hospital.  [facility] is not responsible for any belongings or valuables.  Contacts, dentures or bridgework may not be worn into surgery.  Leave your suitcase in the car.  After surgery it may be brought to your room.  For patients admitted to the hospital, discharge time will be determined by your treatment team.  Patients discharged the day of surgery will not be allowed to drive home.    [place]- Preparing For Surgery  Before surgery, you can play an important role. Because skin is not sterile,  your skin needs to be as free of germs as possible. You can reduce the number of germs on your skin by washing with CHG (chlorahexidine gluconate) Soap before surgery.  CHG is an antiseptic cleaner which kills germs and bonds with the skin to continue killing germs even after washing.    Oral Hygiene is also important to reduce your risk of infection.  Remember - BRUSH YOUR TEETH THE MORNING OF SURGERY WITH YOUR REGULAR TOOTHPASTE  Please do not use if you have an allergy to CHG or antibacterial soaps. If your skin becomes reddened/irritated stop using the CHG.  Do not shave (including legs and underarms) for at least 48 hours prior to first CHG shower. It is OK to shave your face.  Please follow these instructions carefully.   1. Shower the NIGHT BEFORE SURGERY and the MORNING OF SURGERY with CHG.   2. If you chose to wash your hair, wash your hair first as usual with your normal shampoo.  3. After you shampoo, rinse your hair and body thoroughly to remove the shampoo.  4. Use CHG as you would any other liquid soap. You can apply CHG directly to the skin and wash gently with a scrungie or a clean washcloth.   5. Apply the CHG Soap to your body ONLY FROM THE NECK DOWN.  Do not use on open wounds or open sores. Avoid contact with your eyes, ears, mouth and genitals (private parts). Wash Face and genitals (private parts)  with your normal soap.  6. Wash thoroughly, paying special attention to the area where your surgery will be performed.  7. Thoroughly rinse your body with warm water from the neck down.  8. DO NOT shower/wash with your normal soap after using and rinsing off the CHG Soap.  9. Pat yourself dry with a CLEAN TOWEL.  10. Wear CLEAN PAJAMAS to bed the night before surgery, wear comfortable clothes the morning of surgery  11. Place CLEAN SHEETS on your bed the night of your first shower and DO NOT SLEEP WITH PETS.    Day of Surgery:  Do not apply any deodorants/lotions.   Please wear clean clothes to the hospital/surgery center.   Remember to brush your teeth WITH YOUR REGULAR TOOTHPASTE.    Please read over the following fact sheets that you were given.

## 2018-06-26 ENCOUNTER — Encounter (HOSPITAL_COMMUNITY): Payer: Self-pay

## 2018-06-26 ENCOUNTER — Encounter (HOSPITAL_COMMUNITY)
Admission: RE | Admit: 2018-06-26 | Discharge: 2018-06-26 | Disposition: A | Discharge status: Home / Self Care | Payer: 59 | Source: Ambulatory Visit | Attending: Neurosurgery | Admitting: Neurosurgery

## 2018-06-26 ENCOUNTER — Other Ambulatory Visit: Payer: Self-pay

## 2018-06-26 DIAGNOSIS — S32029A Unspecified fracture of second lumbar vertebra, initial encounter for closed fracture: Secondary | ICD-10-CM | POA: Diagnosis not present

## 2018-06-26 DIAGNOSIS — I10 Essential (primary) hypertension: Secondary | ICD-10-CM | POA: Diagnosis not present

## 2018-06-26 DIAGNOSIS — M797 Fibromyalgia: Secondary | ICD-10-CM | POA: Diagnosis not present

## 2018-06-26 DIAGNOSIS — Z794 Long term (current) use of insulin: Secondary | ICD-10-CM | POA: Diagnosis not present

## 2018-06-26 DIAGNOSIS — Z87442 Personal history of urinary calculi: Secondary | ICD-10-CM | POA: Diagnosis not present

## 2018-06-26 DIAGNOSIS — Z87891 Personal history of nicotine dependence: Secondary | ICD-10-CM | POA: Insufficient documentation

## 2018-06-26 DIAGNOSIS — F419 Anxiety disorder, unspecified: Secondary | ICD-10-CM | POA: Diagnosis not present

## 2018-06-26 DIAGNOSIS — M069 Rheumatoid arthritis, unspecified: Secondary | ICD-10-CM | POA: Diagnosis not present

## 2018-06-26 DIAGNOSIS — Z01818 Encounter for other preprocedural examination: Secondary | ICD-10-CM | POA: Diagnosis present

## 2018-06-26 DIAGNOSIS — E109 Type 1 diabetes mellitus without complications: Secondary | ICD-10-CM | POA: Insufficient documentation

## 2018-06-26 DIAGNOSIS — Z79899 Other long term (current) drug therapy: Secondary | ICD-10-CM | POA: Insufficient documentation

## 2018-06-26 DIAGNOSIS — K219 Gastro-esophageal reflux disease without esophagitis: Secondary | ICD-10-CM | POA: Diagnosis not present

## 2018-06-26 HISTORY — DX: Essential (primary) hypertension: I10

## 2018-06-26 HISTORY — DX: Personal history of urinary calculi: Z87.442

## 2018-06-26 LAB — SURGICAL PCR SCREEN
MRSA, PCR: NEGATIVE
STAPHYLOCOCCUS AUREUS: POSITIVE — AB

## 2018-06-26 LAB — GLUCOSE, CAPILLARY: Glucose-Capillary: 337 mg/dL — ABNORMAL HIGH (ref 70–99)

## 2018-06-26 LAB — CBC
HCT: 39.7 % (ref 39.0–52.0)
HEMOGLOBIN: 12.2 g/dL — AB (ref 13.0–17.0)
MCH: 30.1 pg (ref 26.0–34.0)
MCHC: 30.7 g/dL (ref 30.0–36.0)
MCV: 98 fL (ref 80.0–100.0)
Platelets: 267 10*3/uL (ref 150–400)
RBC: 4.05 MIL/uL — ABNORMAL LOW (ref 4.22–5.81)
RDW: 12.5 % (ref 11.5–15.5)
WBC: 4.9 10*3/uL (ref 4.0–10.5)
nRBC: 0 % (ref 0.0–0.2)

## 2018-06-26 LAB — TYPE AND SCREEN
ABO/RH(D): O POS
Antibody Screen: NEGATIVE

## 2018-06-26 LAB — ABO/RH: ABO/RH(D): O POS

## 2018-06-26 NOTE — Progress Notes (Signed)
PCP -  Elfredia Nevins MD  Chest x-ray - N/A EKG - 06/26/18  Fasting Blood Sugar -  Pt taken off insulin pump within last two days and switched to sliding scale. FBS on IP was always in 120s. Last two days not on pump has been high 200s. 337 morning of PAT appointment Checks Blood Sugar 4 times a day.  Blood Thinner Instructions: N/A Aspirin Instructions: N/A  Anesthesia review: yes, due to high CBG  Patient denies shortness of breath, fever, cough and chest pain at PAT appointment   Patient verbalized understanding of instructions that were given to them at the PAT appointment. Patient was also instructed that they will need to review over the PAT instructions again at home before surgery.
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
## 2018-06-27 NOTE — Anesthesia Preprocedure Evaluation (Addendum)
Anesthesia Evaluation  Patient identified by MRN, date of birth, ID band Patient awake    Reviewed: Allergy & Precautions, NPO status , Patient's Chart, lab work & pertinent test results  History of Anesthesia Complications Negative for: history of anesthetic complications  Airway Mallampati: II  TM Distance: >3 FB Neck ROM: Full    Dental no notable dental hx.    Pulmonary neg pulmonary ROS, former smoker,    Pulmonary exam normal        Cardiovascular hypertension, Normal cardiovascular exam     Neuro/Psych PSYCHIATRIC DISORDERS Anxiety Depression L2 compression fracture    GI/Hepatic Neg liver ROS, GERD  ,  Endo/Other  diabetes, Poorly Controlled, Type 1, Insulin Dependent  Renal/GU negative Renal ROS  negative genitourinary   Musculoskeletal  (+) Fibromyalgia -  Abdominal   Peds  Hematology negative hematology ROS (+)   Anesthesia Other Findings 37 yo M with L2 compression fx for T12-L3 fixation - anxiety, HTN, GERD, type 1 DM (A1c 8.3)  Reproductive/Obstetrics                           Anesthesia Physical Anesthesia Plan  ASA: III  Anesthesia Plan: General   Post-op Pain Management:    Induction: Intravenous  PONV Risk Score and Plan: 2 and Ondansetron, Dexamethasone, Midazolam and Treatment may vary due to age or medical condition  Airway Management Planned: Oral ETT  Additional Equipment: None  Intra-op Plan:   Post-operative Plan: Extubation in OR  Informed Consent: I have reviewed the patients History and Physical, chart, labs and discussed the procedure including the risks, benefits and alternatives for the proposed anesthesia with the patient or authorized representative who has indicated his/her understanding and acceptance.   Dental advisory given  Plan Discussed with:   Anesthesia Plan Comments: (See PAT note 06/26/2018 by Antionette Poles, PA-C )       Anesthesia Quick Evaluation

## 2018-07-03 ENCOUNTER — Encounter: Payer: Self-pay | Admitting: "Endocrinology

## 2018-07-03 ENCOUNTER — Other Ambulatory Visit: Payer: Self-pay

## 2018-07-03 ENCOUNTER — Ambulatory Visit (INDEPENDENT_AMBULATORY_CARE_PROVIDER_SITE_OTHER): Payer: 59 | Admitting: "Endocrinology

## 2018-07-03 VITALS — BP 134/82 | HR 89 | Ht 69.0 in | Wt 190.0 lb

## 2018-07-03 DIAGNOSIS — I10 Essential (primary) hypertension: Secondary | ICD-10-CM | POA: Diagnosis not present

## 2018-07-03 DIAGNOSIS — E1065 Type 1 diabetes mellitus with hyperglycemia: Secondary | ICD-10-CM

## 2018-07-03 DIAGNOSIS — E559 Vitamin D deficiency, unspecified: Secondary | ICD-10-CM | POA: Diagnosis not present

## 2018-07-03 DIAGNOSIS — E782 Mixed hyperlipidemia: Secondary | ICD-10-CM

## 2018-07-03 MED ORDER — VITAMIN D3 125 MCG (5000 UT) PO CAPS: 5000.0000 [IU] | ORAL_CAPSULE | Freq: Every day | ORAL | 0 refills | Status: DC
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
## 2018-07-04 ENCOUNTER — Inpatient Hospital Stay (HOSPITAL_COMMUNITY)
Admission: RE | Admit: 2018-07-04 | Discharge: 2018-07-07 | DRG: 458 | Disposition: A | Discharge status: Home / Self Care | Payer: 59 | Attending: Neurosurgery | Admitting: Neurosurgery

## 2018-07-04 ENCOUNTER — Other Ambulatory Visit: Payer: Self-pay

## 2018-07-04 ENCOUNTER — Inpatient Hospital Stay (HOSPITAL_COMMUNITY): Payer: 59

## 2018-07-04 ENCOUNTER — Encounter (HOSPITAL_COMMUNITY): Payer: Self-pay | Admitting: *Deleted

## 2018-07-04 ENCOUNTER — Inpatient Hospital Stay (HOSPITAL_COMMUNITY): Payer: 59 | Admitting: Physician Assistant

## 2018-07-04 ENCOUNTER — Encounter (HOSPITAL_COMMUNITY)
Admission: RE | Disposition: A | Discharge status: Home / Self Care | Payer: Self-pay | Source: Home / Self Care | Attending: Neurosurgery

## 2018-07-04 DIAGNOSIS — I10 Essential (primary) hypertension: Secondary | ICD-10-CM | POA: Diagnosis present

## 2018-07-04 DIAGNOSIS — M4014 Other secondary kyphosis, thoracic region: Secondary | ICD-10-CM | POA: Diagnosis present

## 2018-07-04 DIAGNOSIS — S32021A Stable burst fracture of second lumbar vertebra, initial encounter for closed fracture: Principal | ICD-10-CM | POA: Diagnosis present

## 2018-07-04 DIAGNOSIS — Z794 Long term (current) use of insulin: Secondary | ICD-10-CM | POA: Diagnosis not present

## 2018-07-04 DIAGNOSIS — E1165 Type 2 diabetes mellitus with hyperglycemia: Secondary | ICD-10-CM | POA: Diagnosis present

## 2018-07-04 DIAGNOSIS — M5416 Radiculopathy, lumbar region: Secondary | ICD-10-CM | POA: Diagnosis present

## 2018-07-04 DIAGNOSIS — F419 Anxiety disorder, unspecified: Secondary | ICD-10-CM | POA: Diagnosis present

## 2018-07-04 DIAGNOSIS — M4856XA Collapsed vertebra, not elsewhere classified, lumbar region, initial encounter for fracture: Secondary | ICD-10-CM | POA: Diagnosis present

## 2018-07-04 DIAGNOSIS — M4056 Lordosis, unspecified, lumbar region: Secondary | ICD-10-CM | POA: Diagnosis present

## 2018-07-04 DIAGNOSIS — Z833 Family history of diabetes mellitus: Secondary | ICD-10-CM | POA: Diagnosis not present

## 2018-07-04 DIAGNOSIS — Z8249 Family history of ischemic heart disease and other diseases of the circulatory system: Secondary | ICD-10-CM | POA: Diagnosis not present

## 2018-07-04 DIAGNOSIS — Z419 Encounter for procedure for purposes other than remedying health state, unspecified: Secondary | ICD-10-CM

## 2018-07-04 DIAGNOSIS — Z8379 Family history of other diseases of the digestive system: Secondary | ICD-10-CM

## 2018-07-04 LAB — GLUCOSE, CAPILLARY
Glucose-Capillary: 111 mg/dL — ABNORMAL HIGH (ref 70–99)
Glucose-Capillary: 126 mg/dL — ABNORMAL HIGH (ref 70–99)
Glucose-Capillary: 216 mg/dL — ABNORMAL HIGH (ref 70–99)
Glucose-Capillary: 228 mg/dL — ABNORMAL HIGH (ref 70–99)
Glucose-Capillary: 234 mg/dL — ABNORMAL HIGH (ref 70–99)
Glucose-Capillary: 247 mg/dL — ABNORMAL HIGH (ref 70–99)

## 2018-07-04 SURGERY — POSTERIOR LUMBAR FUSION 3 LEVEL
Anesthesia: General | Site: Back

## 2018-07-04 MED ORDER — PHENOL 1.4 % MT LIQD: 1.0000 | OROMUCOSAL | Status: DC | PRN

## 2018-07-04 MED ORDER — LIDOCAINE 2% (20 MG/ML) 5 ML SYRINGE
INTRAMUSCULAR | Status: DC | PRN
  Administered 2018-07-04: 100 mg via INTRAVENOUS

## 2018-07-04 MED ORDER — HYDROMORPHONE HCL 1 MG/ML IJ SOLN
INTRAMUSCULAR | Status: AC
  Administered 2018-07-04: 0.5 mg via INTRAVENOUS
  Filled 2018-07-04: qty 1

## 2018-07-04 MED ORDER — ONDANSETRON HCL 4 MG/2ML IJ SOLN: 4.0000 mg | Freq: Once | INTRAMUSCULAR | Status: DC | PRN

## 2018-07-04 MED ORDER — ONDANSETRON HCL 4 MG/2ML IJ SOLN
INTRAMUSCULAR | Status: AC
  Filled 2018-07-04: qty 2

## 2018-07-04 MED ORDER — ACETAMINOPHEN 10 MG/ML IV SOLN
INTRAVENOUS | Status: DC | PRN
  Administered 2018-07-04: 1000 mg via INTRAVENOUS

## 2018-07-04 MED ORDER — HYDROMORPHONE HCL 1 MG/ML IJ SOLN
INTRAMUSCULAR | Status: AC
  Filled 2018-07-04: qty 1

## 2018-07-04 MED ORDER — THROMBIN 5000 UNITS EX SOLR
CUTANEOUS | Status: AC
  Filled 2018-07-04: qty 5000

## 2018-07-04 MED ORDER — SODIUM CHLORIDE 0.9 % IV SOLN
250.0000 mL | INTRAVENOUS | Status: DC
  Administered 2018-07-04: 250 mL via INTRAVENOUS

## 2018-07-04 MED ORDER — METHOCARBAMOL 1000 MG/10ML IJ SOLN
500.0000 mg | Freq: Four times a day (QID) | INTRAVENOUS | Status: DC | PRN
  Filled 2018-07-04: qty 5

## 2018-07-04 MED ORDER — PHENYLEPHRINE 40 MCG/ML (10ML) SYRINGE FOR IV PUSH (FOR BLOOD PRESSURE SUPPORT)
PREFILLED_SYRINGE | INTRAVENOUS | Status: DC | PRN
  Administered 2018-07-04: 80 ug via INTRAVENOUS

## 2018-07-04 MED ORDER — ROCURONIUM BROMIDE 50 MG/5ML IV SOSY
PREFILLED_SYRINGE | INTRAVENOUS | Status: DC | PRN
  Administered 2018-07-04: 20 mg via INTRAVENOUS
  Administered 2018-07-04 (×2): 50 mg via INTRAVENOUS

## 2018-07-04 MED ORDER — PROPOFOL 10 MG/ML IV BOLUS
INTRAVENOUS | Status: AC
  Filled 2018-07-04: qty 20

## 2018-07-04 MED ORDER — BISACODYL 10 MG RE SUPP: 10.0000 mg | Freq: Every day | RECTAL | Status: DC | PRN

## 2018-07-04 MED ORDER — THROMBIN 5000 UNITS EX SOLR
OROMUCOSAL | Status: DC | PRN
  Administered 2018-07-04: 09:00:00 via TOPICAL

## 2018-07-04 MED ORDER — LACTATED RINGERS IV SOLN
INTRAVENOUS | Status: DC | PRN
  Administered 2018-07-04 (×2): via INTRAVENOUS

## 2018-07-04 MED ORDER — CHLORHEXIDINE GLUCONATE CLOTH 2 % EX PADS: 6.0000 | MEDICATED_PAD | Freq: Once | CUTANEOUS | Status: DC

## 2018-07-04 MED ORDER — NALOXONE HCL 0.4 MG/ML IJ SOLN: 0.4000 mg | INTRAMUSCULAR | Status: DC | PRN

## 2018-07-04 MED ORDER — PROPOFOL 10 MG/ML IV BOLUS
INTRAVENOUS | Status: DC | PRN
  Administered 2018-07-04: 200 mg via INTRAVENOUS

## 2018-07-04 MED ORDER — PHENYLEPHRINE 40 MCG/ML (10ML) SYRINGE FOR IV PUSH (FOR BLOOD PRESSURE SUPPORT)
PREFILLED_SYRINGE | INTRAVENOUS | Status: AC
  Filled 2018-07-04: qty 10

## 2018-07-04 MED ORDER — MENTHOL 3 MG MT LOZG: 1.0000 | LOZENGE | OROMUCOSAL | Status: DC | PRN

## 2018-07-04 MED ORDER — OXYCODONE HCL 5 MG PO TABS
ORAL_TABLET | ORAL | Status: AC
  Filled 2018-07-04: qty 2

## 2018-07-04 MED ORDER — LIDOCAINE-EPINEPHRINE 1 %-1:100000 IJ SOLN
INTRAMUSCULAR | Status: AC
  Filled 2018-07-04: qty 1

## 2018-07-04 MED ORDER — SODIUM CHLORIDE 0.9% FLUSH: 9.0000 mL | INTRAVENOUS | Status: DC | PRN

## 2018-07-04 MED ORDER — IBUPROFEN-FAMOTIDINE 800-26.6 MG PO TABS: 1.0000 | ORAL_TABLET | Freq: Two times a day (BID) | ORAL | Status: DC

## 2018-07-04 MED ORDER — DIAZEPAM 5 MG/ML IJ SOLN
5.0000 mg | Freq: Once | INTRAMUSCULAR | Status: AC
  Administered 2018-07-04: 2.5 mg via INTRAVENOUS

## 2018-07-04 MED ORDER — PHENYLEPHRINE HCL 10 MG/ML IJ SOLN
INTRAMUSCULAR | Status: DC | PRN
  Administered 2018-07-04 (×5): 80 ug via INTRAVENOUS

## 2018-07-04 MED ORDER — OXYCODONE HCL 5 MG/5ML PO SOLN: 5.0000 mg | Freq: Once | ORAL | Status: DC | PRN

## 2018-07-04 MED ORDER — DIAZEPAM 5 MG/ML IJ SOLN
INTRAMUSCULAR | Status: AC
  Administered 2018-07-04: 2.5 mg
  Filled 2018-07-04: qty 2

## 2018-07-04 MED ORDER — KETAMINE HCL 50 MG/5ML IJ SOSY
PREFILLED_SYRINGE | INTRAMUSCULAR | Status: AC
  Filled 2018-07-04: qty 5

## 2018-07-04 MED ORDER — LORATADINE 10 MG PO TABS
10.0000 mg | ORAL_TABLET | Freq: Every day | ORAL | Status: DC
  Filled 2018-07-04 (×2): qty 1

## 2018-07-04 MED ORDER — HYDROMORPHONE HCL 1 MG/ML IJ SOLN
0.5000 mg | INTRAMUSCULAR | Status: DC | PRN
  Administered 2018-07-04: 1 mg via INTRAVENOUS
  Filled 2018-07-04: qty 1

## 2018-07-04 MED ORDER — FENTANYL CITRATE (PF) 250 MCG/5ML IJ SOLN
INTRAMUSCULAR | Status: AC
  Filled 2018-07-04: qty 5

## 2018-07-04 MED ORDER — LIDOCAINE-EPINEPHRINE 1 %-1:100000 IJ SOLN
INTRAMUSCULAR | Status: DC | PRN
  Administered 2018-07-04: 10 mL

## 2018-07-04 MED ORDER — OXYCODONE HCL 5 MG PO TABS: 5.0000 mg | ORAL_TABLET | Freq: Once | ORAL | Status: DC | PRN

## 2018-07-04 MED ORDER — CEFAZOLIN SODIUM-DEXTROSE 2-4 GM/100ML-% IV SOLN
2.0000 g | INTRAVENOUS | Status: AC
  Administered 2018-07-04: 2 g via INTRAVENOUS

## 2018-07-04 MED ORDER — PANTOPRAZOLE SODIUM 40 MG IV SOLR: 40.0000 mg | Freq: Every day | INTRAVENOUS | Status: DC

## 2018-07-04 MED ORDER — MIDAZOLAM HCL 5 MG/5ML IJ SOLN
INTRAMUSCULAR | Status: DC | PRN
  Administered 2018-07-04: 2 mg via INTRAVENOUS

## 2018-07-04 MED ORDER — ZOLPIDEM TARTRATE 5 MG PO TABS: 5.0000 mg | ORAL_TABLET | Freq: Every evening | ORAL | Status: DC | PRN

## 2018-07-04 MED ORDER — KCL IN DEXTROSE-NACL 20-5-0.45 MEQ/L-%-% IV SOLN
INTRAVENOUS | Status: DC
  Administered 2018-07-05: 23:00:00 via INTRAVENOUS
  Filled 2018-07-04 (×2): qty 1000

## 2018-07-04 MED ORDER — THROMBIN 20000 UNITS EX SOLR
CUTANEOUS | Status: AC
  Filled 2018-07-04: qty 20000

## 2018-07-04 MED ORDER — KETAMINE HCL 10 MG/ML IJ SOLN
INTRAMUSCULAR | Status: DC | PRN
  Administered 2018-07-04: 40 mg via INTRAVENOUS
  Administered 2018-07-04: 10 mg via INTRAVENOUS
  Administered 2018-07-04: 30 mg via INTRAVENOUS
  Administered 2018-07-04: 20 mg via INTRAVENOUS

## 2018-07-04 MED ORDER — DIPHENHYDRAMINE HCL 50 MG/ML IJ SOLN: 12.5000 mg | Freq: Four times a day (QID) | INTRAMUSCULAR | Status: DC | PRN

## 2018-07-04 MED ORDER — ALPRAZOLAM 0.5 MG PO TABS
2.0000 mg | ORAL_TABLET | Freq: Four times a day (QID) | ORAL | Status: DC
  Administered 2018-07-04 – 2018-07-07 (×10): 2 mg via ORAL
  Filled 2018-07-04 (×10): qty 4

## 2018-07-04 MED ORDER — SUGAMMADEX SODIUM 200 MG/2ML IV SOLN
INTRAVENOUS | Status: DC | PRN
  Administered 2018-07-04: 200 mg via INTRAVENOUS

## 2018-07-04 MED ORDER — PANTOPRAZOLE SODIUM 40 MG PO TBEC
40.0000 mg | DELAYED_RELEASE_TABLET | Freq: Every day | ORAL | Status: DC
  Administered 2018-07-04 – 2018-07-06 (×3): 40 mg via ORAL
  Filled 2018-07-04 (×3): qty 1

## 2018-07-04 MED ORDER — TIZANIDINE HCL 4 MG PO TABS
4.0000 mg | ORAL_TABLET | Freq: Three times a day (TID) | ORAL | Status: DC
  Administered 2018-07-04 – 2018-07-06 (×8): 4 mg via ORAL
  Filled 2018-07-04 (×9): qty 1

## 2018-07-04 MED ORDER — FUROSEMIDE 20 MG PO TABS
20.0000 mg | ORAL_TABLET | Freq: Every day | ORAL | Status: DC
  Administered 2018-07-05 – 2018-07-07 (×3): 20 mg via ORAL
  Filled 2018-07-04 (×3): qty 1

## 2018-07-04 MED ORDER — ACETAMINOPHEN 10 MG/ML IV SOLN
INTRAVENOUS | Status: AC
  Filled 2018-07-04: qty 100

## 2018-07-04 MED ORDER — LISINOPRIL 20 MG PO TABS
20.0000 mg | ORAL_TABLET | Freq: Every day | ORAL | Status: DC
  Administered 2018-07-05 – 2018-07-07 (×3): 20 mg via ORAL
  Filled 2018-07-04 (×3): qty 1

## 2018-07-04 MED ORDER — SODIUM CHLORIDE 0.9% FLUSH
3.0000 mL | Freq: Two times a day (BID) | INTRAVENOUS | Status: DC
  Administered 2018-07-05: 3 mL via INTRAVENOUS

## 2018-07-04 MED ORDER — FENTANYL CITRATE (PF) 100 MCG/2ML IJ SOLN
INTRAMUSCULAR | Status: DC | PRN
  Administered 2018-07-04: 100 ug via INTRAVENOUS
  Administered 2018-07-04 (×3): 50 ug via INTRAVENOUS

## 2018-07-04 MED ORDER — INSULIN ASPART 100 UNIT/ML ~~LOC~~ SOLN
0.0000 [IU] | Freq: Three times a day (TID) | SUBCUTANEOUS | Status: DC
  Administered 2018-07-04: 5 [IU] via SUBCUTANEOUS
  Administered 2018-07-05: 3 [IU] via SUBCUTANEOUS
  Administered 2018-07-05: 2 [IU] via SUBCUTANEOUS
  Administered 2018-07-06: 5 [IU] via SUBCUTANEOUS

## 2018-07-04 MED ORDER — ROCURONIUM BROMIDE 50 MG/5ML IV SOSY
PREFILLED_SYRINGE | INTRAVENOUS | Status: AC
  Filled 2018-07-04: qty 5

## 2018-07-04 MED ORDER — QUETIAPINE FUMARATE 50 MG PO TABS
100.0000 mg | ORAL_TABLET | Freq: Every day | ORAL | Status: DC
  Administered 2018-07-04 – 2018-07-06 (×3): 100 mg via ORAL
  Filled 2018-07-04 (×3): qty 2

## 2018-07-04 MED ORDER — HYDROMORPHONE HCL 1 MG/ML IJ SOLN
0.2500 mg | INTRAMUSCULAR | Status: DC | PRN
  Administered 2018-07-04 (×4): 0.5 mg via INTRAVENOUS

## 2018-07-04 MED ORDER — METHOCARBAMOL 500 MG PO TABS
ORAL_TABLET | ORAL | Status: AC
  Filled 2018-07-04: qty 1

## 2018-07-04 MED ORDER — HYDROCODONE-ACETAMINOPHEN 10-325 MG PO TABS
2.0000 | ORAL_TABLET | ORAL | Status: DC
  Administered 2018-07-04 – 2018-07-07 (×14): 2 via ORAL
  Filled 2018-07-04 (×15): qty 2

## 2018-07-04 MED ORDER — POLYETHYLENE GLYCOL 3350 17 G PO PACK: 17.0000 g | PACK | Freq: Every day | ORAL | Status: DC | PRN

## 2018-07-04 MED ORDER — BUPIVACAINE LIPOSOME 1.3 % IJ SUSP
20.0000 mL | INTRAMUSCULAR | Status: AC
  Administered 2018-07-04: 20 mL
  Filled 2018-07-04: qty 20

## 2018-07-04 MED ORDER — MIDAZOLAM HCL 2 MG/2ML IJ SOLN
INTRAMUSCULAR | Status: AC
  Filled 2018-07-04: qty 2

## 2018-07-04 MED ORDER — VITAMIN D 25 MCG (1000 UNIT) PO TABS
5000.0000 [IU] | ORAL_TABLET | Freq: Every day | ORAL | Status: DC
  Administered 2018-07-05 – 2018-07-07 (×3): 5000 [IU] via ORAL
  Filled 2018-07-04 (×4): qty 5

## 2018-07-04 MED ORDER — ACETAMINOPHEN 650 MG RE SUPP: 650.0000 mg | RECTAL | Status: DC | PRN

## 2018-07-04 MED ORDER — INSULIN GLARGINE 100 UNIT/ML ~~LOC~~ SOLN
22.0000 [IU] | Freq: Every day | SUBCUTANEOUS | Status: DC
  Administered 2018-07-04 – 2018-07-06 (×3): 22 [IU] via SUBCUTANEOUS
  Filled 2018-07-04 (×4): qty 0.22

## 2018-07-04 MED ORDER — 0.9 % SODIUM CHLORIDE (POUR BTL) OPTIME
TOPICAL | Status: DC | PRN
  Administered 2018-07-04: 1000 mL

## 2018-07-04 MED ORDER — KETOROLAC TROMETHAMINE 30 MG/ML IJ SOLN
INTRAMUSCULAR | Status: AC
  Filled 2018-07-04: qty 1

## 2018-07-04 MED ORDER — SODIUM CHLORIDE 0.9 % IV SOLN
INTRAVENOUS | Status: DC | PRN
  Administered 2018-07-04: 10 ug/min via INTRAVENOUS

## 2018-07-04 MED ORDER — HYDROMORPHONE HCL 1 MG/ML IJ SOLN
1.0000 mg | Freq: Once | INTRAMUSCULAR | Status: AC
  Administered 2018-07-04: 1 mg via INTRAVENOUS

## 2018-07-04 MED ORDER — BUPIVACAINE HCL (PF) 0.5 % IJ SOLN
INTRAMUSCULAR | Status: AC
  Filled 2018-07-04: qty 30

## 2018-07-04 MED ORDER — HYDROMORPHONE HCL 1 MG/ML IJ SOLN
0.5000 mg | INTRAMUSCULAR | Status: DC | PRN
  Administered 2018-07-04 (×2): 0.5 mg via INTRAVENOUS

## 2018-07-04 MED ORDER — BUSPIRONE HCL 15 MG PO TABS
15.0000 mg | ORAL_TABLET | Freq: Two times a day (BID) | ORAL | Status: DC
  Administered 2018-07-04 – 2018-07-07 (×6): 15 mg via ORAL
  Filled 2018-07-04 (×8): qty 1

## 2018-07-04 MED ORDER — DOCUSATE SODIUM 100 MG PO CAPS
100.0000 mg | ORAL_CAPSULE | Freq: Two times a day (BID) | ORAL | Status: DC
  Administered 2018-07-04 – 2018-07-07 (×6): 100 mg via ORAL
  Filled 2018-07-04 (×6): qty 1

## 2018-07-04 MED ORDER — OXYCODONE HCL 5 MG PO TABS
5.0000 mg | ORAL_TABLET | ORAL | Status: DC | PRN
  Administered 2018-07-04: 5 mg via ORAL
  Filled 2018-07-04: qty 1

## 2018-07-04 MED ORDER — HYDROMORPHONE 1 MG/ML IV SOLN
INTRAVENOUS | Status: DC
  Administered 2018-07-04: 0.3 mg via INTRAVENOUS
  Administered 2018-07-05: 3.5 mg via INTRAVENOUS
  Administered 2018-07-05: 25 mg via INTRAVENOUS
  Administered 2018-07-05: 2.7 mg via INTRAVENOUS
  Administered 2018-07-06: 5.1 mg via INTRAVENOUS
  Administered 2018-07-07: 25 mg via INTRAVENOUS
  Administered 2018-07-07: 3 mg via INTRAVENOUS
  Filled 2018-07-04 (×3): qty 25

## 2018-07-04 MED ORDER — CEFAZOLIN SODIUM-DEXTROSE 2-4 GM/100ML-% IV SOLN
INTRAVENOUS | Status: AC
  Filled 2018-07-04: qty 100

## 2018-07-04 MED ORDER — ATORVASTATIN CALCIUM 80 MG PO TABS
80.0000 mg | ORAL_TABLET | Freq: Every morning | ORAL | Status: DC
  Administered 2018-07-05 – 2018-07-07 (×3): 80 mg via ORAL
  Filled 2018-07-04 (×3): qty 1

## 2018-07-04 MED ORDER — ONDANSETRON HCL 4 MG/2ML IJ SOLN: 4.0000 mg | Freq: Four times a day (QID) | INTRAMUSCULAR | Status: DC | PRN

## 2018-07-04 MED ORDER — BUPIVACAINE HCL (PF) 0.5 % IJ SOLN
INTRAMUSCULAR | Status: DC | PRN
  Administered 2018-07-04: 10 mL

## 2018-07-04 MED ORDER — SODIUM CHLORIDE 0.9% FLUSH: 3.0000 mL | INTRAVENOUS | Status: DC | PRN

## 2018-07-04 MED ORDER — INSULIN ASPART 100 UNIT/ML ~~LOC~~ SOLN
4.0000 [IU] | Freq: Three times a day (TID) | SUBCUTANEOUS | Status: DC
  Administered 2018-07-04 – 2018-07-06 (×5): 4 [IU] via SUBCUTANEOUS

## 2018-07-04 MED ORDER — METHOCARBAMOL 500 MG PO TABS
500.0000 mg | ORAL_TABLET | Freq: Four times a day (QID) | ORAL | Status: DC | PRN
  Administered 2018-07-04 – 2018-07-07 (×5): 500 mg via ORAL
  Filled 2018-07-04 (×5): qty 1

## 2018-07-04 MED ORDER — CEFAZOLIN SODIUM-DEXTROSE 2-4 GM/100ML-% IV SOLN
2.0000 g | Freq: Three times a day (TID) | INTRAVENOUS | Status: AC
  Administered 2018-07-04 – 2018-07-05 (×2): 2 g via INTRAVENOUS
  Filled 2018-07-04 (×2): qty 100

## 2018-07-04 MED ORDER — FLEET ENEMA 7-19 GM/118ML RE ENEM: 1.0000 | ENEMA | Freq: Once | RECTAL | Status: DC | PRN

## 2018-07-04 MED ORDER — KETOROLAC TROMETHAMINE 30 MG/ML IJ SOLN
30.0000 mg | Freq: Once | INTRAMUSCULAR | Status: AC
  Administered 2018-07-04: 30 mg via INTRAVENOUS

## 2018-07-04 MED ORDER — OXYCODONE HCL 5 MG PO TABS
10.0000 mg | ORAL_TABLET | ORAL | Status: DC | PRN
  Administered 2018-07-04 – 2018-07-07 (×6): 10 mg via ORAL
  Filled 2018-07-04 (×5): qty 2

## 2018-07-04 MED ORDER — ONDANSETRON HCL 4 MG PO TABS: 4.0000 mg | ORAL_TABLET | Freq: Four times a day (QID) | ORAL | Status: DC | PRN

## 2018-07-04 MED ORDER — ALUM & MAG HYDROXIDE-SIMETH 200-200-20 MG/5ML PO SUSP: 30.0000 mL | Freq: Four times a day (QID) | ORAL | Status: DC | PRN

## 2018-07-04 MED ORDER — DIPHENHYDRAMINE HCL 12.5 MG/5ML PO ELIX
12.5000 mg | ORAL_SOLUTION | Freq: Four times a day (QID) | ORAL | Status: DC | PRN
  Filled 2018-07-04: qty 10

## 2018-07-04 MED ORDER — DEXAMETHASONE SODIUM PHOSPHATE 10 MG/ML IJ SOLN
INTRAMUSCULAR | Status: DC | PRN
  Administered 2018-07-04: 10 mg via INTRAVENOUS

## 2018-07-04 MED ORDER — INSULIN ASPART 100 UNIT/ML ~~LOC~~ SOLN
0.0000 [IU] | Freq: Every day | SUBCUTANEOUS | Status: DC
  Administered 2018-07-04: 2 [IU] via SUBCUTANEOUS

## 2018-07-04 MED ORDER — ACETAMINOPHEN 325 MG PO TABS: 650.0000 mg | ORAL_TABLET | ORAL | Status: DC | PRN

## 2018-07-04 SURGICAL SUPPLY — 78 items
BASKET BONE COLLECTION (BASKET) ×3 IMPLANT
BLADE CLIPPER SURG (BLADE) ×3 IMPLANT
BONE CANC CHIPS 40CC CAN1/2 (Bone Implant) ×6 IMPLANT
BUR MATCHSTICK NEURO 3.0 LAGG (BURR) ×3 IMPLANT
BUR PRECISION FLUTE 5.0 (BURR) ×3 IMPLANT
CANISTER SUCT 3000ML PPV (MISCELLANEOUS) ×3 IMPLANT
CARTRIDGE OIL MAESTRO DRILL (MISCELLANEOUS) ×1 IMPLANT
CHIPS CANC BONE 40CC CAN1/2 (Bone Implant) ×2 IMPLANT
CONT SPEC 4OZ CLIKSEAL STRL BL (MISCELLANEOUS) ×3 IMPLANT
COVER BACK TABLE 60X90IN (DRAPES) ×3 IMPLANT
COVER WAND RF STERILE (DRAPES) ×3 IMPLANT
DECANTER SPIKE VIAL GLASS SM (MISCELLANEOUS) ×6 IMPLANT
DERMABOND ADVANCED (GAUZE/BANDAGES/DRESSINGS) ×2
DERMABOND ADVANCED .7 DNX12 (GAUZE/BANDAGES/DRESSINGS) ×1 IMPLANT
DIFFUSER DRILL AIR PNEUMATIC (MISCELLANEOUS) ×3 IMPLANT
DRAPE C-ARM 42X72 X-RAY (DRAPES) ×3 IMPLANT
DRAPE C-ARMOR (DRAPES) ×3 IMPLANT
DRAPE LAPAROTOMY 100X72X124 (DRAPES) ×3 IMPLANT
DRAPE SURG 17X23 STRL (DRAPES) ×3 IMPLANT
DRSG OPSITE POSTOP 4X8 (GAUZE/BANDAGES/DRESSINGS) ×3 IMPLANT
DURAPREP 26ML APPLICATOR (WOUND CARE) ×3 IMPLANT
ELECT REM PT RETURN 9FT ADLT (ELECTROSURGICAL) ×3
ELECTRODE REM PT RTRN 9FT ADLT (ELECTROSURGICAL) ×1 IMPLANT
EVACUATOR 1/8 PVC DRAIN (DRAIN) ×3 IMPLANT
GAUZE 4X4 16PLY RFD (DISPOSABLE) IMPLANT
GAUZE SPONGE 4X4 12PLY STRL (GAUZE/BANDAGES/DRESSINGS) ×3 IMPLANT
GLOVE BIO SURGEON STRL SZ7.5 (GLOVE) ×3 IMPLANT
GLOVE BIO SURGEON STRL SZ8 (GLOVE) ×6 IMPLANT
GLOVE BIOGEL PI IND STRL 8 (GLOVE) ×2 IMPLANT
GLOVE BIOGEL PI IND STRL 8.5 (GLOVE) ×2 IMPLANT
GLOVE BIOGEL PI INDICATOR 8 (GLOVE) ×4
GLOVE BIOGEL PI INDICATOR 8.5 (GLOVE) ×4
GLOVE ECLIPSE 7.0 STRL STRAW (GLOVE) ×3 IMPLANT
GLOVE ECLIPSE 8.0 STRL XLNG CF (GLOVE) ×6 IMPLANT
GLOVE EXAM NITRILE XL STR (GLOVE) IMPLANT
GLOVE INDICATOR 7.5 STRL GRN (GLOVE) ×3 IMPLANT
GOWN STRL REUS W/ TWL LRG LVL3 (GOWN DISPOSABLE) IMPLANT
GOWN STRL REUS W/ TWL XL LVL3 (GOWN DISPOSABLE) ×2 IMPLANT
GOWN STRL REUS W/TWL 2XL LVL3 (GOWN DISPOSABLE) ×6 IMPLANT
GOWN STRL REUS W/TWL LRG LVL3 (GOWN DISPOSABLE)
GOWN STRL REUS W/TWL XL LVL3 (GOWN DISPOSABLE) ×4
HEMOSTAT POWDER KIT SURGIFOAM (HEMOSTASIS) ×3 IMPLANT
KIT BASIN OR (CUSTOM PROCEDURE TRAY) ×3 IMPLANT
KIT POSITION SURG JACKSON T1 (MISCELLANEOUS) ×3 IMPLANT
KIT TURNOVER KIT B (KITS) ×3 IMPLANT
MARKER SKIN DUAL TIP RULER LAB (MISCELLANEOUS) ×3 IMPLANT
MILL MEDIUM DISP (BLADE) IMPLANT
NEEDLE HYPO 21X1.5 SAFETY (NEEDLE) ×3 IMPLANT
NEEDLE HYPO 25X1 1.5 SAFETY (NEEDLE) ×3 IMPLANT
NEEDLE SPNL 18GX3.5 QUINCKE PK (NEEDLE) IMPLANT
NS IRRIG 1000ML POUR BTL (IV SOLUTION) ×3 IMPLANT
OIL CARTRIDGE MAESTRO DRILL (MISCELLANEOUS) ×3
PACK LAMINECTOMY NEURO (CUSTOM PROCEDURE TRAY) ×3 IMPLANT
PAD ARMBOARD 7.5X6 YLW CONV (MISCELLANEOUS) ×9 IMPLANT
PATTIES SURGICAL .5 X.5 (GAUZE/BANDAGES/DRESSINGS) IMPLANT
PATTIES SURGICAL .5 X1 (DISPOSABLE) IMPLANT
PATTIES SURGICAL 1X1 (DISPOSABLE) IMPLANT
ROD RELINE-O 5.5X300 STRT NS (Rod) ×1 IMPLANT
ROD RELINE-O 5.5X300MM STRT (Rod) ×2 IMPLANT
SCREW LOCK RELINE 5.5 TULIP (Screw) ×24 IMPLANT
SCREW RELINE-O POLY 5.5X45MM (Screw) ×6 IMPLANT
SCREW RELINE-O POLY 6.5X40 (Screw) ×6 IMPLANT
SCREW RELINE-O POLY 6.5X45 (Screw) ×6 IMPLANT
SCREW RELINE-O POLY 6.5X50MM (Screw) ×6 IMPLANT
SPONGE LAP 4X18 RFD (DISPOSABLE) IMPLANT
SPONGE SURGIFOAM ABS GEL 100 (HEMOSTASIS) IMPLANT
STAPLER SKIN PROX WIDE 3.9 (STAPLE) IMPLANT
SUT VIC AB 1 CT1 18XBRD ANBCTR (SUTURE) ×2 IMPLANT
SUT VIC AB 1 CT1 8-18 (SUTURE) ×4
SUT VIC AB 2-0 CT1 18 (SUTURE) ×6 IMPLANT
SUT VIC AB 3-0 SH 8-18 (SUTURE) ×6 IMPLANT
SYR 5ML LL (SYRINGE) IMPLANT
SYRINGE 20CC LL (MISCELLANEOUS) ×3 IMPLANT
SYSTEM NUVAMAP OR (SYSTAGENIX WOUND MANAGEMENT) ×3 IMPLANT
TOWEL GREEN STERILE (TOWEL DISPOSABLE) ×3 IMPLANT
TOWEL GREEN STERILE FF (TOWEL DISPOSABLE) ×3 IMPLANT
TRAY FOLEY MTR SLVR 16FR STAT (SET/KITS/TRAYS/PACK) ×3 IMPLANT
WATER STERILE IRR 1000ML POUR (IV SOLUTION) ×3 IMPLANT

## 2018-07-04 NOTE — Interval H&P Note (Signed)
History and Physical Interval Note:  07/04/2018 7:28 AM  Donald Wall  has presented today for surgery, with the diagnosis of Compression fracture of L2 vertebra  The various methods of treatment have been discussed with the patient and family. After consideration of risks, benefits and other options for treatment, the patient has consented to  Procedure(s) with comments: Thoracic 12 to Lumbar 3 fixation with Evlyn Clines Osteotomies (N/A) - Thoracic 12 to Lumbar 3 fixation with Evlyn Clines Osteotomies as a surgical intervention .  The patient's history has been reviewed, patient examined, no change in status, stable for surgery.  I have reviewed the patient's chart and labs.  Questions were answered to the patient's satisfaction.     Dorian Heckle

## 2018-07-04 NOTE — Transfer of Care (Signed)
Immediate Anesthesia Transfer of Care Note  Patient: Donald Wall  Procedure(s) Performed: Thoracic twelve to Lumbar three fixation with Evlyn Clines Osteotomies (N/A Back)  Patient Location: PACU  Anesthesia Type:General  Level of Consciousness: awake and drowsy  Airway & Oxygen Therapy: Patient Spontanous Breathing and Patient connected to nasal cannula oxygen  Post-op Assessment: Report given to RN and Post -op Vital signs reviewed and stable  Post vital signs: Reviewed and stable  Last Vitals:  Vitals Value Taken Time  BP 135/80 07/04/2018 10:44 AM  Temp 36.4 C 07/04/2018 10:45 AM  Pulse 76 07/04/2018 10:50 AM  Resp 22 07/04/2018 10:50 AM  SpO2 100 % 07/04/2018 10:50 AM  Vitals shown include unvalidated device data.  Last Pain:  Vitals:   07/04/18 1045  TempSrc:   PainSc: Asleep      Patients Stated Pain Goal: 1 (07/04/18 0622)  Complications: No apparent anesthesia complications

## 2018-07-04 NOTE — Progress Notes (Addendum)
Inpatient Diabetes Program Recommendations  AACE/ADA: New Consensus Statement on Inpatient Glycemic Control (2015)  Target Ranges:  Prepandial:   less than 140 mg/dL      Peak postprandial:   less than 180 mg/dL (1-2 hours)      Critically ill patients:  140 - 180 mg/dL   Lab Results  Component Value Date   GLUCAP 111 (H) 07/04/2018   HGBA1C 8.3 (H) 06/24/2018    Review of Glycemic Control Results for Donald Wall, Donald Wall" (MRN [id number]) as of 07/04/2018 14:17  Ref. Range 07/04/2018 06:04 07/04/2018 10:46  Glucose-Capillary Latest Ref Range: 70 - 99 mg/dL 970 (H) 263 (H)   Diabetes history: Type 1 DM since age 37 Outpatient Diabetes medications: Basaglar 20 units q HS, Humalog 5 units tid with meals Current orders for Inpatient glycemic control:  Novolog moderate tid with meals and HS Inpatient Diabetes Program Recommendations:   Note patient has Type 1 DM and therefore will need both basal/bolus insulin. Please add Lantus 20 units q HS and Novolog meal coverage 4 units tid with meals. Secure text sent to MD.  Will also discuss with RN.   Thanks,  Beryl Meager, RN, BC-ADM Inpatient Diabetes Coordinator Pager [phone] (8a-5p)  1500- Spoke with patient.  He states that he takes 22 units of basal insulin/basaglar at home.  We discussed need for basal/bolus insulin while in the hospital.  Patient complaining about not seeing RN and about needing pain medicine.  Discussed with RN and she had already seen patient and went promptly in to see patient and give his pain medicine. Also discussed patient with charge RN due to his complaints while I was in his room. Called Dr. Fredrich Birks office regarding insulin orders.  Patient needs orders for both basal and bolus insulin due to history of Type 1 DM .    1535 Called into OR and received orders from Dr. Venetia Maxon.

## 2018-07-04 NOTE — Progress Notes (Signed)
Patient has been belligerent with nursing staff and Security has been involved.  He is complaining of pain.  I will go ahead and start Dilaudid PCA.  I met with patient and discussed situation with him and his nurse.

## 2018-07-04 NOTE — Brief Op Note (Signed)
07/04/2018  10:51 AM  PATIENT:  Donald Wall  37 y.o. male  PRE-OPERATIVE DIAGNOSIS:  Burst fracture of L2 vertebra with kyphotic spinal deformity, lumbago, delayed healing, loss of sagittal balance  POST-OPERATIVE DIAGNOSIS:   Burst fracture of L2 vertebra with kyphotic spinal deformity, lumbago, delayed healing, loss of sagittal balance   PROCEDURE:  Procedure(s) with comments: Thoracic twelve to Lumbar three fixation with Evlyn Clines Osteotomies (N/A) - Thoracic twelve to Lumbar three fixation with Evlyn Clines Osteotomies with posterolateral arthrodesis, correction of late spinal kyphotic deformity and loss of sagittal balance  SURGEON:  Surgeon(s) and Role:    Maeola Harman, MD - Primary  PHYSICIAN ASSISTANT:   ASSISTANTS: Poteat, RN   ANESTHESIA:   general  EBL:  150 mL   BLOOD ADMINISTERED:none  DRAINS: (Medium) Hemovact drain(s) in the epidural space with  Suction Open   LOCAL MEDICATIONS USED:  MARCAINE    and LIDOCAINE   SPECIMEN:  No Specimen  DISPOSITION OF SPECIMEN:  N/A  COUNTS:  YES  TOURNIQUET:  * No tourniquets in log *  DICTATION: Patient is 37 year old man with kyphotic spinal deformity secondary to L 2 burst fracture burst fracture with delayed healing. It was elected to take him to surgery for Evlyn Clines osteotomies at L 12 and fusion at T 12   through  L 3  levels.    Procedure: Patient was placed in a prone position on the [place] table after smooth and uncomplicated induction of general endotracheal anesthesia. His mid to low back was prepped and draped in usual sterile fashion with Hibiclens and Chloraprep. Area of incision was infiltrated with local lidocaine. Incision was made to the lumbodorsal fascia was incised and exposure was performed of the T 12  through L 3 spinous processes laminae facet joint and transverse processes. Intraoperative x-ray was obtained which confirmed correct orientation. Pedicle screws were placed at T  12  (6.5 x 40) , L 1 (5.5 x 45) , L 2 (6.5 x 45) , L 3 (6.5 x 50) levels bilaterally using AP and Lateral fluoroscopy.   A Evlyn Clines osteotomy was performed at L 1 2 with disarticulation of the facet joints at this level and thorough decompression was performed of both L1, L2  nerve roots along with the common dural tube. 120 mm rods were cut and affixed to the screw heads and locked down with compression to restore lumbar lordosis.  Preop LL was 11 degrees; Intraop was 33 degrees and postop was 41 degrees. Facetectomies were performed at T 12 L 1 and L 23 levels prior to correction of deformity.   The posterolateral region was extensively decorticated and the posterolateral region was packed with  bone autograft with 80 cc cancellous bone chips. Incision was infiltrated 20 cc of long-acting Marcaine was used in the posterolateral soft tissue. A medium Hemovac drain was placed and anchored with a separate stitch.  Fascia was closed with 1 Vicryl sutures skin edges were reapproximated 2 and 3-0 Vicryl sutures. The wound is dressed with Dermabond and an occlusive dressing.   the patient was extubated in the operating room and taken to recovery in stable satisfactory condition he tolerated traction well counts were correct at the end of the case.   PLAN OF CARE: Admit to inpatient   PATIENT DISPOSITION:  PACU - hemodynamically stable.   Delay start of Pharmacological VTE agent (>24hrs) due to surgical blood loss or risk of bleeding: yes

## 2018-07-04 NOTE — Op Note (Signed)
07/04/2018  10:51 AM  PATIENT:  Donald Wall  37 y.o. male  PRE-OPERATIVE DIAGNOSIS:  Burst fracture of L2 vertebra with kyphotic spinal deformity, lumbago, delayed healing, loss of sagittal balance  POST-OPERATIVE DIAGNOSIS:   Burst fracture of L2 vertebra with kyphotic spinal deformity, lumbago, delayed healing, loss of sagittal balance   PROCEDURE:  Procedure(s) with comments: Thoracic twelve to Lumbar three fixation with Smith Peterson Osteotomies (N/A) - Thoracic twelve to Lumbar three fixation with Smith Peterson Osteotomies with posterolateral arthrodesis, correction of late spinal kyphotic deformity and loss of sagittal balance  SURGEON:  Surgeon(s) and Role:    * Zasha Belleau, MD - Primary  PHYSICIAN ASSISTANT:   ASSISTANTS: Poteat, RN   ANESTHESIA:   general  EBL:  150 mL   BLOOD ADMINISTERED:none  DRAINS: (Medium) Hemovact drain(s) in the epidural space with  Suction Open   LOCAL MEDICATIONS USED:  MARCAINE    and LIDOCAINE   SPECIMEN:  No Specimen  DISPOSITION OF SPECIMEN:  N/A  COUNTS:  YES  TOURNIQUET:  * No tourniquets in log *  DICTATION: Patient is 37-year-old man with kyphotic spinal deformity secondary to L 2 burst fracture burst fracture with delayed healing. It was elected to take him to surgery for Smith Peterson osteotomies at L 12 and fusion at T 12   through  L 3  levels.    Procedure: Patient was placed in a prone position on the [place] table after smooth and uncomplicated induction of general endotracheal anesthesia. His mid to low back was prepped and draped in usual sterile fashion with Hibiclens and Chloraprep. Area of incision was infiltrated with local lidocaine. Incision was made to the lumbodorsal fascia was incised and exposure was performed of the T 12  through L 3 spinous processes laminae facet joint and transverse processes. Intraoperative x-ray was obtained which confirmed correct orientation. Pedicle screws were placed at T  12  (6.5 x 40) , L 1 (5.5 x 45) , L 2 (6.5 x 45) , L 3 (6.5 x 50) levels bilaterally using AP and Lateral fluoroscopy.   A Smith Peterson osteotomy was performed at L 1 2 with disarticulation of the facet joints at this level and thorough decompression was performed of both L1, L2  nerve roots along with the common dural tube. 120 mm rods were cut and affixed to the screw heads and locked down with compression to restore lumbar lordosis.  Preop LL was 11 degrees; Intraop was 33 degrees and postop was 41 degrees. Facetectomies were performed at T 12 L 1 and L 23 levels prior to correction of deformity.   The posterolateral region was extensively decorticated and the posterolateral region was packed with  bone autograft with 80 cc cancellous bone chips. Incision was infiltrated 20 cc of long-acting Marcaine was used in the posterolateral soft tissue. A medium Hemovac drain was placed and anchored with a separate stitch.  Fascia was closed with 1 Vicryl sutures skin edges were reapproximated 2 and 3-0 Vicryl sutures. The wound is dressed with Dermabond and an occlusive dressing.   the patient was extubated in the operating room and taken to recovery in stable satisfactory condition he tolerated traction well counts were correct at the end of the case.   PLAN OF CARE: Admit to inpatient   PATIENT DISPOSITION:  PACU - hemodynamically stable.   Delay start of Pharmacological VTE agent (>24hrs) due to surgical blood loss or risk of bleeding: yes  

## 2018-07-04 NOTE — Anesthesia Postprocedure Evaluation (Signed)
Anesthesia Post Note  Patient: Donald Wall  Procedure(s) Performed: Thoracic twelve to Lumbar three fixation with Evlyn Clines Osteotomies (N/A Back)     Patient location during evaluation: PACU Anesthesia Type: General Level of consciousness: awake and alert Pain management: pain level controlled Vital Signs Assessment: post-procedure vital signs reviewed and stable Respiratory status: spontaneous breathing, nonlabored ventilation and respiratory function stable Cardiovascular status: blood pressure returned to baseline and stable Postop Assessment: no apparent nausea or vomiting Anesthetic complications: no    Last Vitals:  Vitals:   07/04/18 1300 07/04/18 1322  BP: (!) 132/96 136/89  Pulse: 98 92  Resp: 17 18  Temp:  37.2 C  SpO2: 100% 100%    Last Pain:  Vitals:   07/04/18 1300  TempSrc:   PainSc: Asleep                 Lucretia Kern

## 2018-07-04 NOTE — Progress Notes (Signed)
Received report from outgoing RN at bedside pt on with daughter, verbalized some relief of pain rated 7/10 on pain scale. MD's new orders noted awaiting pump from [facility] to resume  Orders. Plan of care discuss with pt. And pt demonstrates good understanding.

## 2018-07-04 NOTE — Progress Notes (Signed)
Pt came to unit from PACU. Pt is alert and oriented. Pt c/o of uncontrolled pain. 10/10. With pain medication. .Nurse provided education to patient about pain management and pain control after surgery. Pt is very upset and cursing. Pt is demanding to see DR Venetia Maxon ASAP. MD notified.

## 2018-07-04 NOTE — Progress Notes (Signed)
RN received report that pt verbalized to rounding admin that he has not seen anyone since he has been on the unit and pt wanted to speak to a charge nurse. Upon entering room, pt's nurse noted administering pain medication, pt noted on the phone with "someone" with explicit languages. Pt wanted to know why his wife is not allow to visit him. He also wanted more pain medication. RN listened and as I attempt to explain to him that I need to investigate bc we are unaware of the situation so I can assist him but he continue to be agitate w/ inappropriate language. RN was not able to converse while pt continue to yell not allow RN to talk. Pt continue to ask for Dr. Venetia Maxon. RN left room at this time to prevent escalation of his anger. Security called so that pt can understand that he cannot speak to staff any kind of way. Left message with Dr. Venetia Maxon nurse. PT's nurse aware of all this issue.

## 2018-07-04 NOTE — Anesthesia Procedure Notes (Signed)
Procedure Name: Intubation Date/Time: 07/04/2018 7:41 AM Performed by: Inda Coke, CRNA Pre-anesthesia Checklist: Patient identified, Emergency Drugs available, Suction available and Patient being monitored Patient Re-evaluated:Patient Re-evaluated prior to induction Oxygen Delivery Method: Circle System Utilized Preoxygenation: Pre-oxygenation with 100% oxygen Induction Type: IV induction Ventilation: Mask ventilation without difficulty Laryngoscope Size: Glidescope and 4 Grade View: Grade I Tube type: Oral Tube size: 7.5 mm Number of attempts: 1 Airway Equipment and Method: Stylet,  Oral airway and Video-laryngoscopy Placement Confirmation: ETT inserted through vocal cords under direct vision,  positive ETCO2 and breath sounds checked- equal and bilateral Secured at: 22 cm Tube secured with: Tape Dental Injury: Teeth and Oropharynx as per pre-operative assessment  Difficulty Due To: Difficulty was unanticipated and Difficult Airway- due to anterior larynx Comments: DL X 1 with MAC 4 blade grade III view. DL X 2 with glidescope mac 4 blade and grade I view with successful intubation

## 2018-07-04 NOTE — Progress Notes (Signed)
Patient is awake, alert, conversant.  MAEW with full strength.  He is complaining of back pain.  He is doing well.

## 2018-07-05 LAB — GLUCOSE, CAPILLARY
GLUCOSE-CAPILLARY: 153 mg/dL — AB (ref 70–99)
Glucose-Capillary: 137 mg/dL — ABNORMAL HIGH (ref 70–99)
Glucose-Capillary: 173 mg/dL — ABNORMAL HIGH (ref 70–99)
Glucose-Capillary: 100 mg/dL — ABNORMAL HIGH (ref 70–99)
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
## 2018-07-05 NOTE — Progress Notes (Signed)
Pt arrived unit yesterday with personal belonging. Pt has a big black bag on his bed with him and will not allow the nurse to remove bag. Nurse advice pt that it will only be normal to know what is in the bag and document pt's belonging due to hospital stay and d/c. pt's black bag had few novels, a shaving bag that contained syringes and a pink and white pill box that had medications. Nurse advised pt that medications can be sent to pharmacy, or be sent home. Pt Understands that home med's can not be taken with hospital medication. Pt refused and would prefer to keep his bag.   RN is unable to recognize any pills in the pill box at the moment and will continue to assess pt for safety.

## 2018-07-05 NOTE — Progress Notes (Signed)
Postop day 1.  Patient with a lot of difficulty with pain control postoperatively.  Now better on PCA.  No lower extremity symptoms.  No symptoms of numbness paresthesias weakness.  He is afebrile.  His vital signs are stable.  His urine output is good.  He is awake and alert.  He appears comfortable.  Motor and sensory function of the extremities are normal.  Wound is clean and dry.  Abdomen soft.  Progressing well following lumbar fusion.  Begin efforts at mobilization.

## 2018-07-05 NOTE — Progress Notes (Signed)
Pt verbalized releif of pain after PCA hydro morphine started.   Cooperative during the shift but very anxious when PCA pump beeps  Education for pain management  Spinal precaution and other safety measures given. given  Foley out Pt voiding. Will continue to monitor pt ,

## 2018-07-05 NOTE — Plan of Care (Signed)
  Problem: Education: Goal: Knowledge of the prescribed therapeutic regimen will improve Outcome: Progressing   Problem: Education: Goal: Understanding of discharge needs will improve Outcome: Progressing   Problem: Activity: Goal: Ability to avoid complications of mobility impairment will improve Outcome: Progressing   Problem: Activity: Goal: Ability to tolerate increased activity will improve Outcome: Progressing

## 2018-07-06 LAB — GLUCOSE, CAPILLARY
GLUCOSE-CAPILLARY: 204 mg/dL — AB (ref 70–99)
Glucose-Capillary: 186 mg/dL — ABNORMAL HIGH (ref 70–99)
Glucose-Capillary: 183 mg/dL — ABNORMAL HIGH (ref 70–99)
Glucose-Capillary: 84 mg/dL (ref 70–99)

## 2018-07-06 NOTE — Progress Notes (Signed)
P.t. requested another tray. Informed that he is on a diabetic/ low carb diet. P.t. states he will monitor his sugar.
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
## 2018-07-06 NOTE — Progress Notes (Signed)
Neurosurgery Service Progress Note  Subjective: No acute events overnight, complaining of back pain, frustrated that PCA is not delivering additional doses in between lock-out times   Objective: Vitals:   07/06/18 0455 07/06/18 0741 07/06/18 0751 07/06/18 1228  BP:   104/61 (!) 84/57  Pulse:   67 71  Resp: (!) 21 20 20 14   Temp:   97.6 F (36.4 C) 98.2 F (36.8 C)  TempSrc:   Oral Oral  SpO2: 99% 98% 100% 100%   Temp (24hrs), Avg:97.8 F (36.6 C), Min:97.4 F (36.3 C), Max:98.2 F (36.8 C)  CBC Latest Ref Rng & Units 06/26/2018 05/12/2018 05/12/2018  WBC 4.0 - 10.5 K/uL 4.9 - 6.0  Hemoglobin 13.0 - 17.0 g/dL 12.2(L) 12.6(L) 13.5  Hematocrit 39.0 - 52.0 % 39.7 37.0(L) 40.3  Platelets 150 - 400 K/uL 267 - 264   BMP Latest Ref Rng & Units 06/24/2018 05/12/2018 05/12/2018  Glucose 65 - 139 mg/dL 05/14/2018) 469(G) 295(M)  BUN 7 - 25 mg/dL 841(L) 24(M) 01(U)  Creatinine 0.60 - 1.35 mg/dL 27(O 5.36) 6.44(I)  BUN/Creat Ratio 6 - 22 (calc) 29(H) - -  Sodium 135 - 146 mmol/L 140 135 130(L)  Potassium 3.5 - 5.3 mmol/L 4.5 3.8 4.2  Chloride 98 - 110 mmol/L 103 98 92(L)  CO2 20 - 32 mmol/L 31 - 26  Calcium 8.6 - 10.3 mg/dL 9.7 - 9.9    Intake/Output Summary (Last 24 hours) at 07/06/2018 1247 Last data filed at 07/06/2018 1000 Gross per 24 hour  Intake 977.6 ml  Output 4000 ml  Net -3022.4 ml    Current Facility-Administered Medications:  .  0.9 %  sodium chloride infusion, 250 mL, Intravenous, Continuous, 07/08/2018, MD, Last Rate: 1 mL/hr at 07/04/18 1449, 250 mL at 07/04/18 1449 .  acetaminophen (TYLENOL) tablet 650 mg, 650 mg, Oral, Q4H PRN **OR** acetaminophen (TYLENOL) suppository 650 mg, 650 mg, Rectal, Q4H PRN, 07/06/18, MD .  ALPRAZolam Maeola Harman) tablet 2 mg, 2 mg, Oral, QID, Prudy Feeler, MD, 2 mg at 07/06/18 07/08/18 .  alum & mag hydroxide-simeth (MAALOX/MYLANTA) 200-200-20 MG/5ML suspension 30 mL, 30 mL, Oral, Q6H PRN, 01-29-2001, MD .  atorvastatin (LIPITOR)  tablet 80 mg, 80 mg, Oral, q morning - 10a, Maeola Harman, MD, 80 mg at 07/06/18 07/08/18 .  bisacodyl (DULCOLAX) suppository 10 mg, 10 mg, Rectal, Daily PRN, 6387, MD .  busPIRone (BUSPAR) tablet 15 mg, 15 mg, Oral, BID, Maeola Harman, MD, 15 mg at 07/06/18 07/08/18 .  cholecalciferol (VITAMIN D3) tablet 5,000 Units, 5,000 Units, Oral, Daily, 5643, MD, 5,000 Units at 07/06/18 [phone] .  dextrose 5 % and 0.45 % NaCl with KCl 20 mEq/L infusion, , Intravenous, Continuous, 3295, MD, Last Rate: 75 mL/hr at 07/05/18 2250 .  diphenhydrAMINE (BENADRYL) injection 12.5 mg, 12.5 mg, Intravenous, Q6H PRN **OR** diphenhydrAMINE (BENADRYL) 12.5 MG/5ML elixir 12.5 mg, 12.5 mg, Oral, Q6H PRN, 07/07/18, MD .  docusate sodium (COLACE) capsule 100 mg, 100 mg, Oral, BID, Maeola Harman, MD, 100 mg at 07/06/18 07/08/18 .  furosemide (LASIX) tablet 20 mg, 20 mg, Oral, Daily, 1884, MD, 20 mg at 07/06/18 07/08/18 .  HYDROcodone-acetaminophen (NORCO) 10-325 MG per tablet 2 tablet, 2 tablet, Oral, Q4H, 1660, MD, 2 tablet at 07/06/18 [phone] .  HYDROmorphone (DILAUDID) 1 mg/mL PCA injection, , Intravenous, Q4H, 6301, MD, 25 mg at 07/05/18 1707 .  insulin aspart (novoLOG) injection 0-15 Units, 0-15 Units, Subcutaneous, TID WC, 09-[birth year]-1984, MD, 5  Units at 07/06/18 0938 .  insulin aspart (novoLOG) injection 0-5 Units, 0-5 Units, Subcutaneous, QHS, Maeola Harman, MD, 2 Units at 07/04/18 2100 .  insulin aspart (novoLOG) injection 4 Units, 4 Units, Subcutaneous, TID WC, Maeola Harman, MD, 4 Units at 07/06/18 0840 .  insulin glargine (LANTUS) injection 22 Units, 22 Units, Subcutaneous, QHS, Maeola Harman, MD, 22 Units at 07/05/18 2244 .  lisinopril (PRINIVIL,ZESTRIL) tablet 20 mg, 20 mg, Oral, Daily, Maeola Harman, MD, 20 mg at 07/06/18 [phone] .  loratadine (CLARITIN) tablet 10 mg, 10 mg, Oral, Daily, Maeola Harman, MD .  menthol-cetylpyridinium (CEPACOL) lozenge 3 mg, 1 lozenge, Oral, PRN **OR**  phenol (CHLORASEPTIC) mouth spray 1 spray, 1 spray, Mouth/Throat, PRN, Maeola Harman, MD .  methocarbamol (ROBAXIN) tablet 500 mg, 500 mg, Oral, Q6H PRN, 500 mg at 07/06/18 0602 **OR** methocarbamol (ROBAXIN) 500 mg in dextrose 5 % 50 mL IVPB, 500 mg, Intravenous, Q6H PRN, Maeola Harman, MD .  naloxone [facility]) injection 0.4 mg, 0.4 mg, Intravenous, PRN **AND** sodium chloride flush (NS) 0.9 % injection 9 mL, 9 mL, Intravenous, PRN, Maeola Harman, MD .  ondansetron [facility]) tablet 4 mg, 4 mg, Oral, Q6H PRN **OR** ondansetron (ZOFRAN) injection 4 mg, 4 mg, Intravenous, Q6H PRN, Maeola Harman, MD .  ondansetron [facility]) injection 4 mg, 4 mg, Intravenous, Q6H PRN, Maeola Harman, MD .  oxyCODONE (Oxy IR/ROXICODONE) immediate release tablet 10 mg, 10 mg, Oral, Q3H PRN, Maeola Harman, MD, 10 mg at 07/06/18 0836 .  oxyCODONE (Oxy IR/ROXICODONE) immediate release tablet 5 mg, 5 mg, Oral, Q3H PRN, Maeola Harman, MD, 5 mg at 07/04/18 1519 .  pantoprazole (PROTONIX) EC tablet 40 mg, 40 mg, Oral, QHS, Maeola Harman, MD, 40 mg at 07/05/18 2240 .  polyethylene glycol (MIRALAX / GLYCOLAX) packet 17 g, 17 g, Oral, Daily PRN, Maeola Harman, MD .  QUEtiapine (SEROQUEL) tablet 100 mg, 100 mg, Oral, QHS, Maeola Harman, MD, 100 mg at 07/05/18 2240 .  sodium chloride flush (NS) 0.9 % injection 3 mL, 3 mL, Intravenous, Q12H, Maeola Harman, MD, 3 mL at 07/05/18 1042 .  sodium chloride flush (NS) 0.9 % injection 3 mL, 3 mL, Intravenous, PRN, Maeola Harman, MD .  sodium phosphate (FLEET) 7-19 GM/118ML enema 1 enema, 1 enema, Rectal, Once PRN, Maeola Harman, MD .  tiZANidine (ZANAFLEX) tablet 4 mg, 4 mg, Oral, TID, Maeola Harman, MD, 4 mg at 07/06/18 9371 .  zolpidem (AMBIEN) tablet 5 mg, 5 mg, Oral, QHS PRN,MR X 1, Maeola Harman, MD   Physical Exam: Standing at bedside with physical therapy with good strength Incision c/d/i Drain in place w/ serosang drainage  Assessment & Plan: 37 y.o. man s/p T12-L3 PLIF, recovering  well. -drain put out 250cc over 24h but slowing down, likely d/c tomorrow  -still requiring max dose on PCA, explained to him that Dr. Venetia Maxon will likely start weaning the PCA tomorrow -cont working with PT/OT  Jadene Pierini  07/06/18 12:47 PM

## 2018-07-06 NOTE — Plan of Care (Signed)
  Problem: Pain Management: Goal: Pain level will decrease Outcome: Not Progressing   Problem: Activity: Goal: Ability to tolerate increased activity will improve Outcome: Progressing   Problem: Coping: Goal: Level of anxiety will decrease Outcome: Not Progressing

## 2018-07-06 NOTE — Progress Notes (Signed)
P.t. self administered insulin. P.t. refuses to wait on RN for Rn administration. P.t. refuses to send home meds home. Teaching provided, p.t. verbalized understanding, but refuses to send home medicine home.

## 2018-07-07 LAB — GLUCOSE, CAPILLARY
GLUCOSE-CAPILLARY: 46 mg/dL — AB (ref 70–99)
GLUCOSE-CAPILLARY: 174 mg/dL — AB (ref 70–99)
Glucose-Capillary: 105 mg/dL — ABNORMAL HIGH (ref 70–99)
Glucose-Capillary: 197 mg/dL — ABNORMAL HIGH (ref 70–99)

## 2018-07-07 MED ORDER — TIZANIDINE HCL 4 MG PO TABS: 4.0000 mg | ORAL_TABLET | Freq: Three times a day (TID) | ORAL | 1 refills | Status: AC | PRN

## 2018-07-07 MED ORDER — OXYCODONE HCL 10 MG PO TABS: 5.0000 mg | ORAL_TABLET | Freq: Four times a day (QID) | ORAL | 0 refills | Status: DC | PRN

## 2018-07-07 NOTE — Progress Notes (Signed)
Confirming witnessed waste of 37mL dilaudid PCA syringe with Courtney Heys, RN in [employer].
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
## 2018-07-07 NOTE — Discharge Summary (Signed)
Physician Discharge Summary  [name] MRN: [id number] DOB/AGE: 25-May-1981 37 y.o.  Admit date: 07/04/2018 Discharge date: 07/07/2018  Admission Diagnoses: Burst fracture of L2 vertebra with kyphotic spinal deformity, lumbago, delayed healing, loss of sagittal balance    Discharge Diagnoses: Burst fracture of L2 vertebra with kyphotic spinal deformity, lumbago, delayed healing, loss of sagittal balance s/p Thoracic twelve to Lumbar three fixation with Evlyn Clines Osteotomies (N/A) - Thoracic twelve to Lumbar three fixation with Evlyn Clines Osteotomies with posterolateral arthrodesis, correction of late spinal kyphotic deformity and loss of sagittal balance     Active Problems:   Other secondary kyphosis, thoracic region   Discharged Condition: good  Hospital Course: Donald Wall was admitted for surgery with dx L2 Fx and lumbago. Following uncomplicated surgery, he recovered and transferred to 3W for nursing care and therapies. He mobilized well, slowed by reports of pain.   Consults: None  Significant Diagnostic Studies: radiology: X-Ray: intra-op  Treatments: surgery: Thoracic twelve to Lumbar three fixation with Evlyn Clines Osteotomies (N/A) - Thoracic twelve to Lumbar three fixation with Evlyn Clines Osteotomies with posterolateral arthrodesis, correction of late spinal kyphotic deformity and loss of sagittal balance    Discharge Exam: Blood pressure 117/76, pulse 84, temperature 98 F (36.7 C), temperature source Oral, resp. rate 11, SpO2 100 %. Sitting in bed eating breakfast, moves to edge of bed for incision inspection. Reports no pain at present. Good strength BLE. Incision without erythema, swelling, or drainage beneath honeycomb and Dermabond. Hemovac with minimal drainage. Blood sugar elevated this am. Pt aware and managing, self-administering insulin. Counseling has been provided. He has ambulated with PT.      Disposition:  Discharge to home. Pt agrees with plan, acknowledging PCA will stop now and po meds will continue with expected d/c time ~noon. He refuses HHPT, but acknowledges need to be up and about inside the home and verbalizes understanding of d/c instructions. Drain pulled and stay suture removed. DSD to site. Pt may remove dressing tomorrow and shower. Oxydcodone 10mg  q6hrs prn pain (#40 must last 1 week) and Robaxin 500mg  prn spasm will be eRx'ed from office to his pharmacy. He already has office f/u scheduled.  Discharge Instructions    Diet - low sodium heart healthy   Complete by:  As directed    Increase activity slowly   Complete by:  As directed      Allergies as of 07/07/2018   No Known Allergies     Medication List    STOP taking these medications   HYDROcodone-acetaminophen 10-325 MG tablet Commonly known as:  NORCO     TAKE these medications   alprazolam 2 MG tablet Commonly known as:  XANAX Take 2 mg by mouth 4 (four) times daily.   atorvastatin 80 MG tablet Commonly known as:  LIPITOR Take 80 mg by mouth every morning.   BASAGLAR KWIKPEN 100 UNIT/ML Sopn Inject 22 Units into the skin at bedtime.   busPIRone 15 MG tablet Commonly known as:  BUSPAR Take 15 mg by mouth 2 (two) times daily.   DUEXIS 800-26.6 MG Tabs Generic drug:  Ibuprofen-Famotidine Take 1 tablet by mouth 2 (two) times daily.   fexofenadine 180 MG tablet Commonly known as:  ALLEGRA Take 180 mg by mouth 2 (two) times daily.   furosemide 20 MG tablet Commonly known as:  LASIX Take 20 mg by mouth daily.   insulin lispro 100 UNIT/ML injection Commonly known as:  HUMALOG Inject 6-12 Units into the  skin 3 (three) times daily before meals.   lisinopril 20 MG tablet Commonly known as:  PRINIVIL,ZESTRIL Take 20 mg by mouth daily.   Oxycodone HCl 10 MG Tabs Take 0.5-1 tablets (5-10 mg total) by mouth every 6 (six) hours as needed for severe pain ((score 7 to 10)).    QUEtiapine 100 MG tablet Commonly known as:  SEROQUEL Take 100 mg by mouth at bedtime.   tiZANidine 4 MG tablet Commonly known as:  ZANAFLEX Take 1 tablet (4 mg total) by mouth every 8 (eight) hours as needed for muscle spasms. What changed:    when to take this  reasons to take this   Vitamin D3 125 MCG (5000 UT) Caps Take 1 capsule (5,000 Units total) by mouth daily.        Signed: Dorian Heckle, MD 07/07/2018, 8:39 AM

## 2018-07-07 NOTE — Progress Notes (Signed)
Patient took pill from home instead of Claritin. Patient was advised by RN to wait until after discharge since patient will be getting discharged today, but pt chose to take pill now instead of later. Will continue to monitor.

## 2018-07-07 NOTE — Progress Notes (Signed)
Ambulate in hallway with brace. Tolerated well. No c/o of pain during ambulation.

## 2018-07-07 NOTE — Progress Notes (Signed)
Discharge instructions (including medications) discussed with and copy provided to patient/caregiver 

## 2018-07-07 NOTE — Care Management Note (Signed)
Case Management Note  Patient Details  Name: Donald Wall MRN: [id number] Date of Birth: 02-15-1981  Subjective/Objective:       Pt s/p T12-L3 fixation. He is from home with his spouse.              Action/Plan: PT/OT recommending HH services. Pt currently refusing and PA is aware.  Pt with orders for walker and 3in 1. James with [facility] DME notified and delivered the equipment to the room. Pt has transportation home.   Expected Discharge Date:  07/07/18               Expected Discharge Plan:  Home/Self Care  In-House Referral:     Discharge planning Services  CM Consult  Post Acute Care Choice:  Durable Medical Equipment Choice offered to:     DME Arranged:  3-N-1, Walker rolling DME Agency:  [facility]  HH Arranged:    [facility] Agency:     Status of Service:  Completed, signed off  If discussed at Long Length of Stay Meetings, dates discussed:    Additional Comments:  Kermit Balo, RN 07/07/2018, 11:36 AM
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
## 2018-07-07 NOTE — Progress Notes (Addendum)
Subjective: Patient reports "I feel 100% better than I did Friday"  Objective: Vital signs in last 24 hours: Temp:  [97.5 F (36.4 C)-98.2 F (36.8 C)] 98 F (36.7 C) (12/16 0740) Pulse Rate:  [63-84] 84 (12/16 0740) Resp:  [11-16] 11 (12/16 0740) BP: (84-126)/(57-84) 117/76 (12/16 0740) SpO2:  [98 %-100 %] 100 % (12/16 0740)  Intake/Output from previous day: 12/15 0701 - 12/16 0700 In: -  Out: 2100 [Urine:1950; Drains:150] Intake/Output this shift: Total I/O In: 240 [P.O.:240] Out: 625 [Urine:625]  Sitting in bed eating breakfast, moves to edge of bed for incision inspection. Reports no pain at present. Good strength BLE. Incision without erythema, swelling, or drainage beneath honeycomb and Dermabond. Hemovac with minimal drainage. Blood sugar elevated this am. Pt aware and managing, self-administering insulin. Counseling has been provided.  He has ambulated with PT.   Lab Results: No results for input(s): WBC, HGB, HCT, PLT in the last 72 hours. BMET No results for input(s): NA, K, CL, CO2, GLUCOSE, BUN, CREATININE, CALCIUM in the last 72 hours.  Studies/Results: No results found.  Assessment/Plan: Improving  LOS: 3 days  D/c Hemovac, d/c PCA, d/c to home. Pt agrees with plan, acknowledging PCA will stop now and po meds will continue with expected d/c time ~noon. He refuses HHPT, but acknowledges need to be up and about inside the home and verbalizes understanding of d/c instructions. Drain pulled and stay suture removed. DSD to site. Pt may remove dressing tomorrow and shower. Oxydcodone 10mg  q6hrs prn pain (#40 must last 1 week) and Tizanidine 4 mg prn spasm will be eRx'ed from office to his pharmacy. He already has office f/u scheduled.   07/07/2018, 8:14 AM

## 2018-07-07 NOTE — Progress Notes (Signed)
Dilaudid PCA syringe 19 ml wasted in designated container witnessed by [employer]

## 2018-07-07 NOTE — Progress Notes (Signed)
Wasted 2.20ml of hydromorphone (PCA) in stericycle with Autumn Patty, RN.

## 2018-07-08 MED FILL — Heparin Sodium (Porcine) Inj 1000 Unit/ML: INTRAMUSCULAR | Qty: 30 | Status: AC

## 2018-07-08 MED FILL — Sodium Chloride IV Soln 0.9%: INTRAVENOUS | Qty: 1000 | Status: AC

## 2018-07-11 ENCOUNTER — Emergency Department (HOSPITAL_COMMUNITY): Payer: 59

## 2018-07-11 ENCOUNTER — Encounter (HOSPITAL_COMMUNITY): Payer: Self-pay | Admitting: Emergency Medicine

## 2018-07-11 ENCOUNTER — Emergency Department (HOSPITAL_COMMUNITY)
Admission: EM | Admit: 2018-07-11 | Discharge: 2018-07-11 | Disposition: A | Discharge status: Home / Self Care | Payer: 59 | Attending: Emergency Medicine | Admitting: Emergency Medicine

## 2018-07-11 ENCOUNTER — Other Ambulatory Visit: Payer: Self-pay

## 2018-07-11 ENCOUNTER — Other Ambulatory Visit (HOSPITAL_COMMUNITY): Payer: Self-pay

## 2018-07-11 DIAGNOSIS — R4182 Altered mental status, unspecified: Secondary | ICD-10-CM | POA: Insufficient documentation

## 2018-07-11 DIAGNOSIS — J45909 Unspecified asthma, uncomplicated: Secondary | ICD-10-CM | POA: Diagnosis not present

## 2018-07-11 DIAGNOSIS — F1110 Opioid abuse, uncomplicated: Secondary | ICD-10-CM | POA: Insufficient documentation

## 2018-07-11 DIAGNOSIS — E1065 Type 1 diabetes mellitus with hyperglycemia: Secondary | ICD-10-CM | POA: Insufficient documentation

## 2018-07-11 DIAGNOSIS — Z79899 Other long term (current) drug therapy: Secondary | ICD-10-CM | POA: Insufficient documentation

## 2018-07-11 DIAGNOSIS — Z87891 Personal history of nicotine dependence: Secondary | ICD-10-CM | POA: Diagnosis not present

## 2018-07-11 DIAGNOSIS — R739 Hyperglycemia, unspecified: Secondary | ICD-10-CM

## 2018-07-11 DIAGNOSIS — T40605A Adverse effect of unspecified narcotics, initial encounter: Secondary | ICD-10-CM

## 2018-07-11 DIAGNOSIS — I10 Essential (primary) hypertension: Secondary | ICD-10-CM | POA: Diagnosis not present

## 2018-07-11 LAB — COMPREHENSIVE METABOLIC PANEL
ALT: 24 U/L (ref 0–44)
AST: 13 U/L — AB (ref 15–41)
Albumin: 3.4 g/dL — ABNORMAL LOW (ref 3.5–5.0)
Alkaline Phosphatase: 87 U/L (ref 38–126)
Anion gap: 9 (ref 5–15)
BUN: 25 mg/dL — ABNORMAL HIGH (ref 6–20)
CO2: 28 mmol/L (ref 22–32)
Calcium: 8.9 mg/dL (ref 8.9–10.3)
Chloride: 100 mmol/L (ref 98–111)
Creatinine, Ser: 1.14 mg/dL (ref 0.61–1.24)
GFR calc Af Amer: >60 mL/min (ref >60)
Glucose, Bld: 513 mg/dL (ref 70–99)
Potassium: 4.9 mmol/L (ref 3.5–5.1)
Sodium: 137 mmol/L (ref 135–145)
Total Bilirubin: 0.3 mg/dL (ref 0.3–1.2)
Total Protein: 6.6 g/dL (ref 6.5–8.1)

## 2018-07-11 LAB — CBC WITH DIFFERENTIAL/PLATELET
ABS IMMATURE GRANULOCYTES: 0.01 10*3/uL (ref 0.00–0.07)
Basophils Absolute: 0 10*3/uL (ref 0.0–0.1)
Basophils Relative: 0 %
Eosinophils Absolute: 0.2 10*3/uL (ref 0.0–0.5)
Eosinophils Relative: 3 %
HCT: 34.1 % — ABNORMAL LOW (ref 39.0–52.0)
Hemoglobin: 10.4 g/dL — ABNORMAL LOW (ref 13.0–17.0)
Immature Granulocytes: 0 %
Lymphocytes Relative: 20 %
Lymphs Abs: 1.1 10*3/uL (ref 0.7–4.0)
MCH: 30 pg (ref 26.0–34.0)
MCHC: 30.5 g/dL (ref 30.0–36.0)
MCV: 98.3 fL (ref 80.0–100.0)
Monocytes Absolute: 0.5 10*3/uL (ref 0.1–1.0)
Monocytes Relative: 9 %
Neutro Abs: 3.6 10*3/uL (ref 1.7–7.7)
Neutrophils Relative %: 68 %
Platelets: 282 10*3/uL (ref 150–400)
RBC: 3.47 MIL/uL — ABNORMAL LOW (ref 4.22–5.81)
RDW: 12.1 % (ref 11.5–15.5)
WBC: 5.4 10*3/uL (ref 4.0–10.5)
nRBC: 0 % (ref 0.0–0.2)

## 2018-07-11 LAB — URINALYSIS, ROUTINE W REFLEX MICROSCOPIC
Bacteria, UA: NONE SEEN
Bilirubin Urine: NEGATIVE
Glucose, UA: >=500 mg/dL — AB
Hgb urine dipstick: NEGATIVE
Ketones, ur: NEGATIVE mg/dL
Leukocytes, UA: NEGATIVE
Nitrite: NEGATIVE
Protein, ur: NEGATIVE mg/dL
SPECIFIC GRAVITY, URINE: 1.025 (ref 1.005–1.030)
pH: 5 (ref 5.0–8.0)

## 2018-07-11 LAB — RAPID URINE DRUG SCREEN, HOSP PERFORMED
Amphetamines: NOT DETECTED
BENZODIAZEPINES: POSITIVE — AB
Barbiturates: NOT DETECTED
Cocaine: NOT DETECTED
Opiates: POSITIVE — AB
Tetrahydrocannabinol: NOT DETECTED

## 2018-07-11 LAB — ACETAMINOPHEN LEVEL: Acetaminophen (Tylenol), Serum: 22 ug/mL (ref 10–30)

## 2018-07-11 LAB — LIPASE, BLOOD: Lipase: 20 U/L (ref 11–51)

## 2018-07-11 LAB — LACTIC ACID, PLASMA: Lactic Acid, Venous: 0.8 mmol/L (ref 0.5–1.9)

## 2018-07-11 LAB — CBG MONITORING, ED: Glucose-Capillary: 250 mg/dL — ABNORMAL HIGH (ref 70–99)

## 2018-07-11 LAB — ETHANOL: Alcohol, Ethyl (B): 12 mg/dL — ABNORMAL HIGH (ref <10)

## 2018-07-11 LAB — AMMONIA: Ammonia: <9 umol/L — ABNORMAL LOW (ref 9–35)

## 2018-07-11 MED ORDER — SODIUM CHLORIDE 0.9 % IV SOLN
INTRAVENOUS | Status: DC
  Administered 2018-07-11: 19:00:00 via INTRAVENOUS

## 2018-07-11 MED ORDER — SODIUM CHLORIDE 0.9 % IV BOLUS
1000.0000 mL | Freq: Once | INTRAVENOUS | Status: AC
  Administered 2018-07-11: 1000 mL via INTRAVENOUS

## 2018-07-11 MED ORDER — DEXTROSE-NACL 5-0.45 % IV SOLN: INTRAVENOUS | Status: DC

## 2018-07-11 MED ORDER — SODIUM CHLORIDE 0.9 % IV SOLN
INTRAVENOUS | Status: DC
  Administered 2018-07-11: 17:00:00 via INTRAVENOUS

## 2018-07-11 MED ORDER — INSULIN REGULAR(HUMAN) IN NACL 100-0.9 UT/100ML-% IV SOLN
INTRAVENOUS | Status: DC
  Administered 2018-07-11: 4.5 [IU]/h via INTRAVENOUS
  Filled 2018-07-11: qty 100

## 2018-07-11 NOTE — ED Triage Notes (Signed)
Spinal fusion 12/13 Today with slurred speech, seeing things, talking out of his head  Has been taking oxycontin, Oxycodone multiple times daily and spouse does not think it is related to pain meds

## 2018-07-11 NOTE — ED Notes (Signed)
Discharged by Steward Drone, RN

## 2018-07-11 NOTE — Discharge Instructions (Addendum)
As we discussed recommended admission.  For observation and blood sugar control overnight.  Blood sugars are now down to 250.  Understand that you do not want to be admitted.  Follow back up with your primary care provider would stop the oxycodone.  And just go with hydrocodone.  Return for any new or worse symptoms.  No evidence of any infection in the lower part of the back today.  Follow-up with neurosurgery as scheduled.

## 2018-07-11 NOTE — ED Notes (Signed)
Patient had surgery a week ago and has had stroke like symptoms since operation.

## 2018-07-11 NOTE — ED Provider Notes (Addendum)
[facility] EMERGENCY DEPARTMENT Provider Note   CSN: [id number] Arrival date & time: 07/11/18  1435     History   Chief Complaint Chief Complaint  Patient presents with  . Altered Mental Status    HPI Donald Wall is a 37 y.o. male.  Patient brought in by family member for altered mental status and confusion.  Patient with recent spinal surgery by Dr. Venetia Maxon for fixation of T12-L3 following an L2 burst fracture.  Patient was in the hospital December 13 through December 16.  Patient's discharge medications included Robaxin and oxycodone.  The patient was also taking hydrocodone at home.  Patient's wife noted that he has been very confused for the past 4 days.  No fevers.  Patient without any significant or worsening back pain no leg symptoms.     Past Medical History:  Diagnosis Date  . Anxiety   . Arthritis    rheumatoid  . Asthma    problem for one year  09  . Diabetes mellitus    type 1  . Fibromyalgia   . GERD (gastroesophageal reflux disease)    no med  . History of kidney stones    2007-2008  . Hypertension   . Stones in the urinary tract     Patient Active Problem List   Diagnosis Date Noted  . Other secondary kyphosis, thoracic region 07/04/2018  . Vitamin D deficiency 07/03/2018  . Essential hypertension, benign 07/03/2018  . Mixed hyperlipidemia 07/03/2018  . Uncontrolled type 1 diabetes mellitus with hyperglycemia (HCC) 06/24/2018    Past Surgical History:  Procedure Laterality Date  . ADENOIDECTOMY    . CARPAL TUNNEL RELEASE  11   rt  . DIRECT LARYNGOSCOPY  11/21/2011   Procedure: DIRECT LARYNGOSCOPY;  Surgeon: Melvenia Beam, MD;  Location: [facility];  Service: ENT;  Laterality: Right;  Direct Laryngoscopy with Biopsy  . ROTATOR CUFF REPAIR Right   . TONSILLECTOMY    . URETEROSCOPY  07   x2 08 with litho  . WISDOM TOOTH EXTRACTION          Home Medications    Prior to Admission medications   Medication Sig Start Date End Date  Taking? Authorizing Provider  alprazolam Prudy Feeler) 2 MG tablet Take 2 mg by mouth 4 (four) times daily.  02/12/18   [provider]  atorvastatin (LIPITOR) 80 MG tablet Take 80 mg by mouth every morning.     [provider]  busPIRone (BUSPAR) 15 MG tablet Take 15 mg by mouth 2 (two) times daily.     [provider]  Cholecalciferol (VITAMIN D3) 125 MCG (5000 UT) CAPS Take 1 capsule (5,000 Units total) by mouth daily. 07/03/18   Roma Kayser, MD  fexofenadine (ALLEGRA) 180 MG tablet Take 180 mg by mouth 2 (two) times daily.     [provider]  furosemide (LASIX) 20 MG tablet Take 20 mg by mouth daily.    [provider]  Ibuprofen-Famotidine (DUEXIS) 800-26.6 MG TABS Take 1 tablet by mouth 2 (two) times daily.    [provider]  Insulin Glargine (BASAGLAR KWIKPEN) 100 UNIT/ML SOPN Inject 22 Units into the skin at bedtime.    [provider]  insulin lispro (HUMALOG) 100 UNIT/ML injection Inject 6-12 Units into the skin 3 (three) times daily before meals.    [provider]  lisinopril (PRINIVIL,ZESTRIL) 20 MG tablet Take 20 mg by mouth daily.    [provider]  oxyCODONE 10 MG TABS Take  0.5-1 tablets (5-10 mg total) by mouth every 6 (six) hours as needed for severe pain ((score 7 to 10)). 07/07/18   Maeola Harman, MD  QUEtiapine (SEROQUEL) 100 MG tablet Take 100 mg by mouth at bedtime.  02/12/18   [provider]  tiZANidine (ZANAFLEX) 4 MG tablet Take 1 tablet (4 mg total) by mouth every 8 (eight) hours as needed for muscle spasms. 07/07/18   Maeola Harman, MD    Family History History reviewed. No pertinent family history.  Social History Social History   Tobacco Use  . Smoking status: Former Smoker    Packs/day: 1.50    Years: 4.00    Pack years: 6.00    Types: Cigarettes    Last attempt to quit: 10/20/2010    Years since quitting: 7.7  . Smokeless tobacco: Never Used  Substance Use  Topics  . Alcohol use: Yes    Alcohol/week: 1.0 - 2.0 standard drinks    Types: 1 - 2 Cans of beer per week    Comment: per week  . Drug use: Yes    Comment: hx of marijuana use 2000-2005     Allergies   Patient has no known allergies.   Review of Systems Review of Systems  Unable to perform ROS: Mental status change     Physical Exam Updated Vital Signs BP 117/75 (BP Location: Right Arm)   Pulse (!) 106   Temp 98 F (36.7 C) (Oral)   Resp 16   Ht 1.753 m (5\' 9" )   Wt 86.2 kg   SpO2 100%   BMI 28.06 kg/m   Physical Exam Vitals signs and nursing note reviewed.  Constitutional:      General: He is not in acute distress.    Appearance: Normal appearance.  HENT:     Head: Atraumatic.     Mouth/Throat:     Mouth: Mucous membranes are moist.  Eyes:     Extraocular Movements: Extraocular movements intact.     Conjunctiva/sclera: Conjunctivae normal.     Pupils: Pupils are equal, round, and reactive to light.  Neck:     Musculoskeletal: Neck supple. No neck rigidity.     Comments: No neck stiffness or nuchal rigidity. Cardiovascular:     Rate and Rhythm: Normal rate and regular rhythm.     Heart sounds: No murmur.  Pulmonary:     Effort: Pulmonary effort is normal. No respiratory distress.     Breath sounds: Normal breath sounds.  Abdominal:     General: Bowel sounds are normal. There is no distension.     Tenderness: There is no abdominal tenderness.  Musculoskeletal: Normal range of motion.     Right lower leg: No edema.     Left lower leg: No edema.     Comments: Surgical scar to the upper lumbar part of the back without any erythema or any significant tenderness seems to be healing well.  No crepitance.  Neurological:     General: No focal deficit present.     Mental Status: He is alert.     Comments: Alert does not have depressed mental status but acting confused.  Patient without any neurofocal deficits particular to his lower extremity.  No upper  extremity neurofocal deficit.      ED Treatments / Results  Labs (all labs ordered are listed, but only abnormal results are displayed) Labs Reviewed  COMPREHENSIVE METABOLIC PANEL - Abnormal; Notable for the following components:      Result Value  Glucose, Bld 513 (*)    BUN 25 (*)    Albumin 3.4 (*)    AST 13 (*)    All other components within normal limits  CBC WITH DIFFERENTIAL/PLATELET - Abnormal; Notable for the following components:   RBC 3.47 (*)    Hemoglobin 10.4 (*)    HCT 34.1 (*)    All other components within normal limits  ETHANOL - Abnormal; Notable for the following components:   Alcohol, Ethyl (B) 12 (*)    All other components within normal limits  RAPID URINE DRUG SCREEN, HOSP PERFORMED - Abnormal; Notable for the following components:   Opiates POSITIVE (*)    Benzodiazepines POSITIVE (*)    All other components within normal limits  URINALYSIS, ROUTINE W REFLEX MICROSCOPIC - Abnormal; Notable for the following components:   Color, Urine STRAW (*)    Glucose, UA >=500 (*)    All other components within normal limits  AMMONIA - Abnormal; Notable for the following components:   Ammonia <9 (*)    All other components within normal limits  CBG MONITORING, ED - Abnormal; Notable for the following components:   Glucose-Capillary 250 (*)    All other components within normal limits  LIPASE, BLOOD  LACTIC ACID, PLASMA  ACETAMINOPHEN LEVEL    EKG None   ED ECG REPORT   Date: 07/11/2018  Rate: 75  Rhythm: normal sinus rhythm  QRS Axis: normal  Intervals: normal  ST/T Wave abnormalities: normal  Conduction Disutrbances:none  Narrative Interpretation:   Old EKG Reviewed: none available  I have personally reviewed the EKG tracing and agree with the computerized printout as noted.   Radiology Dg Chest 2 View  Result Date: 07/11/2018 CLINICAL DATA:  Altered mental status. EXAM: CHEST - 2 VIEW COMPARISON:  CT of the chest 02/13/2018 FINDINGS:  Heart size is normal. Lung volumes are low. No focal edema or airspace disease present. There are no effusions. Lumbar spinal hardware is noted. IMPRESSION: Negative two view chest x-ray. Electronically Signed   By: Marin Roberts M.D.   On: 07/11/2018 16:17   Ct Head Wo Contrast  Result Date: 07/11/2018 CLINICAL DATA:  Slurred speech. Visual hallucinations. Altered mental status. Taking pain medications. EXAM: CT HEAD WITHOUT CONTRAST TECHNIQUE: Contiguous axial images were obtained from the base of the skull through the vertex without intravenous contrast. COMPARISON:  None. FINDINGS: Brain: Normal appearing cerebral hemispheres and posterior fossa structures. Normal size and position of the ventricles. No intracranial hemorrhage, mass lesion or CT evidence of acute infarction. Vascular: No hyperdense vessel or unexpected calcification. Skull: Normal. Negative for fracture or focal lesion. Sinuses/Orbits: Unremarkable. Other: None. IMPRESSION: Normal examination. Electronically Signed   By: Beckie Salts M.D.   On: 07/11/2018 16:26   Mr Brain Wo Contrast (neuro Protocol)  Result Date: 07/11/2018 CLINICAL DATA:  37 y/o  M; slurred region confusion for 2 days. EXAM: MRI HEAD WITHOUT CONTRAST TECHNIQUE: Multiplanar, multiecho pulse sequences of the brain and surrounding structures were obtained without intravenous contrast. COMPARISON:  07/11/2018 CT head. FINDINGS: Brain: No acute infarction, hemorrhage, hydrocephalus, extra-axial collection or mass lesion. No structural or signal abnormality of the brain. Vascular: Normal flow voids. Skull and upper cervical spine: Normal marrow signal. Sinuses/Orbits: Negative. Other: None. IMPRESSION: No acute intracranial abnormality identified. Unremarkable MRI of the brain. Electronically Signed   By: Mitzi Hansen M.D.   On: 07/11/2018 18:07    Procedures Procedures (including critical care time)  CRITICAL CARE Performed by: Vanetta Mulders  Total critical care time: 30 minutes Critical care time was exclusive of separately billable procedures and treating other patients. Critical care was necessary to treat or prevent imminent or life-threatening deterioration. Critical care was time spent personally by me on the following activities: development of treatment plan with patient and/or surrogate as well as nursing, discussions with consultants, evaluation of patient's response to treatment, examination of patient, obtaining history from patient or surrogate, ordering and performing treatments and interventions, ordering and review of laboratory studies, ordering and review of radiographic studies, pulse oximetry and re-evaluation of patient's condition.   Medications Ordered in ED Medications  0.9 %  sodium chloride infusion ( Intravenous New Bag/Given 07/11/18 1653)  insulin regular, human (MYXREDLIN) 100 units/ 100 mL infusion (1.9 Units/hr Intravenous Rate/Dose Change 07/11/18 1859)  0.9 %  sodium chloride infusion ( Intravenous New Bag/Given 07/11/18 1907)  dextrose 5 %-0.45 % sodium chloride infusion (has no administration in time range)  sodium chloride 0.9 % bolus 1,000 mL (1,000 mLs Intravenous New Bag/Given 07/11/18 1803)     Initial Impression / Assessment and Plan / ED Course  I have reviewed the triage vital signs and the nursing notes.  Pertinent labs & imaging results that were available during my care of the patient were reviewed by me and considered in my medical decision making (see chart for details).     Patient without depressed mental status just talking somewhat confused.  But is alert will follow commands.  No evidence of any neurofocal deficit.  No fever no significant back pain to palpation.  Not concerned about infection in that area.  Lactic acid was normal no leukocytosis.  Head CT negative MRI brain negative.  Urine drug screen positive for benzos and opiates.  Patient's blood sugar was  markedly elevated at 500.  Patient is a known insulin-dependent diabetic no evidence of ketoacidosis.  Patient started on glucose stabilizer.  Blood sugars did come down to the 250 range.  Patient refusing admission but is more alert and wife seems to think he is back to baseline.  We suspect that he is taking too much pain medicine perhaps patient did have a little bit of alcohol in his system as well.  Did recommend admission.  Patient refusing that and wants to go home.  No evidence of any significant infection.  Patient states he is able to follow his blood sugars at home.  Also recommending patient stop the oxycodone and just go with hydrocodone for pain.  Final Clinical Impressions(s) / ED Diagnoses   Final diagnoses:  Altered mental status, unspecified altered mental status type  Hyperglycemia  Narcotic induced mental alteration    ED Discharge Orders    None       Vanetta Mulders, MD 07/11/18 9458    Vanetta Mulders, MD 07/11/18 [phone]

## 2018-07-14 ENCOUNTER — Other Ambulatory Visit: Payer: Self-pay | Admitting: "Endocrinology

## 2018-07-14 MED ORDER — GLUCOSE BLOOD VI STRP: ORAL_STRIP | 6 refills | Status: DC

## 2018-10-03 ENCOUNTER — Ambulatory Visit: Payer: 59 | Admitting: "Endocrinology

## 2018-10-03 ENCOUNTER — Telehealth: Payer: Self-pay

## 2018-10-03 DIAGNOSIS — E782 Mixed hyperlipidemia: Secondary | ICD-10-CM

## 2018-10-03 DIAGNOSIS — I10 Essential (primary) hypertension: Secondary | ICD-10-CM

## 2018-10-03 DIAGNOSIS — E1065 Type 1 diabetes mellitus with hyperglycemia: Secondary | ICD-10-CM

## 2018-10-03 NOTE — Telephone Encounter (Signed)
Donald Wall, CMA  

## 2018-10-04 LAB — COMPLETE METABOLIC PANEL WITH GFR
AG RATIO: 1.9 (calc) (ref 1.0–2.5)
ALT: 18 U/L (ref 9–46)
AST: 19 U/L (ref 10–40)
Albumin: 4.3 g/dL (ref 3.6–5.1)
Alkaline phosphatase (APISO): 109 U/L (ref 36–130)
BUN/Creatinine Ratio: 26 (calc) — ABNORMAL HIGH (ref 6–22)
BUN: 27 mg/dL — ABNORMAL HIGH (ref 7–25)
CO2: 31 mmol/L (ref 20–32)
Calcium: 9.3 mg/dL (ref 8.6–10.3)
Chloride: 104 mmol/L (ref 98–110)
Creat: 1.04 mg/dL (ref 0.60–1.35)
GFR, EST NON AFRICAN AMERICAN: 92 mL/min/{1.73_m2} (ref >=60)
GFR, Est African American: 107 mL/min/{1.73_m2} (ref >=60)
Globulin: 2.3 g/dL (calc) (ref 1.9–3.7)
Glucose, Bld: 243 mg/dL — ABNORMAL HIGH (ref 65–99)
Potassium: 4.8 mmol/L (ref 3.5–5.3)
Sodium: 142 mmol/L (ref 135–146)
TOTAL PROTEIN: 6.6 g/dL (ref 6.1–8.1)
Total Bilirubin: 0.2 mg/dL (ref 0.2–1.2)

## 2018-10-04 LAB — HEMOGLOBIN A1C
EAG (MMOL/L): 10.1 (calc)
Hgb A1c MFr Bld: 8 % of total Hgb — ABNORMAL HIGH (ref <5.7)
MEAN PLASMA GLUCOSE: 183 (calc)

## 2018-10-10 ENCOUNTER — Encounter: Payer: Self-pay | Admitting: "Endocrinology

## 2018-10-10 ENCOUNTER — Other Ambulatory Visit: Payer: Self-pay

## 2018-10-10 ENCOUNTER — Ambulatory Visit (INDEPENDENT_AMBULATORY_CARE_PROVIDER_SITE_OTHER): Payer: 59 | Admitting: "Endocrinology

## 2018-10-10 DIAGNOSIS — E1065 Type 1 diabetes mellitus with hyperglycemia: Secondary | ICD-10-CM | POA: Diagnosis not present

## 2018-10-10 DIAGNOSIS — E559 Vitamin D deficiency, unspecified: Secondary | ICD-10-CM

## 2018-10-10 DIAGNOSIS — E782 Mixed hyperlipidemia: Secondary | ICD-10-CM | POA: Diagnosis not present

## 2018-10-10 DIAGNOSIS — Z794 Long term (current) use of insulin: Secondary | ICD-10-CM

## 2018-10-10 DIAGNOSIS — I10 Essential (primary) hypertension: Secondary | ICD-10-CM
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
## 2018-10-29 ENCOUNTER — Other Ambulatory Visit: Payer: Self-pay | Admitting: "Endocrinology

## 2019-01-09 LAB — COMPLETE METABOLIC PANEL WITH GFR
AG Ratio: 1.6 (calc) (ref 1.0–2.5)
ALT: 19 U/L (ref 9–46)
AST: 20 U/L (ref 10–40)
Albumin: 4.8 g/dL (ref 3.6–5.1)
Alkaline phosphatase (APISO): 76 U/L (ref 36–130)
BUN: 25 mg/dL (ref 7–25)
CO2: 28 mmol/L (ref 20–32)
Calcium: 10 mg/dL (ref 8.6–10.3)
Chloride: 101 mmol/L (ref 98–110)
Creat: 1.03 mg/dL (ref 0.60–1.35)
GFR, EST NON AFRICAN AMERICAN: 93 mL/min/{1.73_m2} (ref >=60)
GFR, Est African American: 108 mL/min/{1.73_m2} (ref >=60)
GLOBULIN: 3 g/dL (ref 1.9–3.7)
Glucose, Bld: 149 mg/dL — ABNORMAL HIGH (ref 65–99)
Potassium: 5.1 mmol/L (ref 3.5–5.3)
Sodium: 139 mmol/L (ref 135–146)
Total Bilirubin: 0.5 mg/dL (ref 0.2–1.2)
Total Protein: 7.8 g/dL (ref 6.1–8.1)

## 2019-01-09 LAB — HEMOGLOBIN A1C
Hgb A1c MFr Bld: 8.1 % of total Hgb — ABNORMAL HIGH (ref <5.7)
Mean Plasma Glucose: 186 (calc)
eAG (mmol/L): 10.3 (calc)

## 2019-01-14 ENCOUNTER — Encounter: Payer: Self-pay | Admitting: "Endocrinology

## 2019-01-14 ENCOUNTER — Other Ambulatory Visit: Payer: Self-pay

## 2019-01-14 ENCOUNTER — Ambulatory Visit (INDEPENDENT_AMBULATORY_CARE_PROVIDER_SITE_OTHER): Payer: 59 | Admitting: "Endocrinology

## 2019-01-14 DIAGNOSIS — E782 Mixed hyperlipidemia: Secondary | ICD-10-CM | POA: Diagnosis not present

## 2019-01-14 DIAGNOSIS — I10 Essential (primary) hypertension: Secondary | ICD-10-CM | POA: Diagnosis not present

## 2019-01-14 DIAGNOSIS — E1065 Type 1 diabetes mellitus with hyperglycemia: Secondary | ICD-10-CM | POA: Diagnosis not present

## 2019-01-14 MED ORDER — LANTUS SOLOSTAR 100 UNIT/ML ~~LOC~~ SOPN: 32.0000 [IU] | PEN_INJECTOR | Freq: Every day | SUBCUTANEOUS | 3 refills | Status: DC
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
## 2019-01-15 ENCOUNTER — Other Ambulatory Visit: Payer: Self-pay

## 2019-01-15 MED ORDER — INSULIN DEGLUDEC 100 UNIT/ML ~~LOC~~ SOPN: 32.0000 [IU] | PEN_INJECTOR | Freq: Every day | SUBCUTANEOUS | 2 refills | Status: DC

## 2019-01-30 ENCOUNTER — Other Ambulatory Visit: Payer: Self-pay | Admitting: "Endocrinology

## 2019-04-09 ENCOUNTER — Other Ambulatory Visit: Payer: Self-pay

## 2019-04-09 MED ORDER — LANTUS SOLOSTAR 100 UNIT/ML ~~LOC~~ SOPN: 32.0000 [IU] | PEN_INJECTOR | Freq: Every day | SUBCUTANEOUS | 0 refills | Status: DC

## 2019-04-10 ENCOUNTER — Other Ambulatory Visit: Payer: Self-pay | Admitting: "Endocrinology

## 2019-04-13 ENCOUNTER — Telehealth: Payer: Self-pay | Admitting: "Endocrinology

## 2019-05-18 ENCOUNTER — Ambulatory Visit: Payer: 59 | Admitting: "Endocrinology

## 2019-05-26 ENCOUNTER — Ambulatory Visit: Payer: 59 | Admitting: "Endocrinology

## 2019-05-28 LAB — COMPLETE METABOLIC PANEL WITH GFR
AG Ratio: 1.6 (calc) (ref 1.0–2.5)
ALT: 22 U/L (ref 9–46)
AST: 25 U/L (ref 10–40)
Albumin: 4 g/dL (ref 3.6–5.1)
Alkaline phosphatase (APISO): 63 U/L (ref 36–130)
BUN: 24 mg/dL (ref 7–25)
CO2: 29 mmol/L (ref 20–32)
Calcium: 8.9 mg/dL (ref 8.6–10.3)
Chloride: 101 mmol/L (ref 98–110)
Creat: 1.25 mg/dL (ref 0.60–1.35)
GFR, Est African American: 85 mL/min/{1.73_m2} (ref >=60)
GFR, Est Non African American: 73 mL/min/{1.73_m2} (ref >=60)
Globulin: 2.5 g/dL (calc) (ref 1.9–3.7)
Glucose, Bld: 342 mg/dL — ABNORMAL HIGH (ref 65–139)
Potassium: 5.3 mmol/L (ref 3.5–5.3)
Sodium: 136 mmol/L (ref 135–146)
Total Bilirubin: 0.4 mg/dL (ref 0.2–1.2)
Total Protein: 6.5 g/dL (ref 6.1–8.1)

## 2019-05-28 LAB — HEMOGLOBIN A1C
Hgb A1c MFr Bld: 8.7 % of total Hgb — ABNORMAL HIGH (ref <5.7)
Mean Plasma Glucose: 203 (calc)
eAG (mmol/L): 11.2 (calc)

## 2019-06-08 ENCOUNTER — Other Ambulatory Visit: Payer: Self-pay | Admitting: "Endocrinology

## 2019-06-10 ENCOUNTER — Ambulatory Visit (INDEPENDENT_AMBULATORY_CARE_PROVIDER_SITE_OTHER): Payer: 59 | Admitting: "Endocrinology

## 2019-06-10 ENCOUNTER — Other Ambulatory Visit: Payer: Self-pay

## 2019-06-10 ENCOUNTER — Encounter: Payer: Self-pay | Admitting: "Endocrinology

## 2019-06-10 DIAGNOSIS — I10 Essential (primary) hypertension: Secondary | ICD-10-CM | POA: Diagnosis not present

## 2019-06-10 DIAGNOSIS — E1065 Type 1 diabetes mellitus with hyperglycemia: Secondary | ICD-10-CM

## 2019-06-10 DIAGNOSIS — E782 Mixed hyperlipidemia: Secondary | ICD-10-CM
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
## 2019-07-08 ENCOUNTER — Ambulatory Visit (INDEPENDENT_AMBULATORY_CARE_PROVIDER_SITE_OTHER): Payer: 59 | Admitting: "Endocrinology

## 2019-07-08 ENCOUNTER — Encounter: Payer: Self-pay | Admitting: "Endocrinology

## 2019-07-08 ENCOUNTER — Other Ambulatory Visit: Payer: Self-pay

## 2019-07-08 DIAGNOSIS — E782 Mixed hyperlipidemia: Secondary | ICD-10-CM | POA: Diagnosis not present

## 2019-07-08 DIAGNOSIS — I10 Essential (primary) hypertension: Secondary | ICD-10-CM

## 2019-07-08 DIAGNOSIS — E1065 Type 1 diabetes mellitus with hyperglycemia: Secondary | ICD-10-CM

## 2019-07-08 MED ORDER — CONTOUR NEXT TEST VI STRP: ORAL_STRIP | 2 refills | Status: DC
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
## 2019-07-09 ENCOUNTER — Other Ambulatory Visit: Payer: Self-pay | Admitting: "Endocrinology

## 2019-07-24 HISTORY — PX: SPINAL CORD STIMULATOR IMPLANT: SHX2422

## 2019-08-24 ENCOUNTER — Other Ambulatory Visit: Payer: Self-pay

## 2019-08-24 MED ORDER — CONTOUR NEXT TEST VI STRP: ORAL_STRIP | 2 refills | Status: DC

## 2019-08-24 NOTE — Telephone Encounter (Signed)
Patient said he will be out of test strips by lunch and has been running out before time to refill next script. He is testing 5x a day.

## 2019-10-08 ENCOUNTER — Ambulatory Visit: Payer: 59 | Admitting: "Endocrinology

## 2019-10-12 ENCOUNTER — Other Ambulatory Visit: Payer: Self-pay

## 2019-10-12 ENCOUNTER — Ambulatory Visit: Payer: 59 | Admitting: "Endocrinology

## 2019-10-12 ENCOUNTER — Encounter: Payer: Self-pay | Admitting: "Endocrinology

## 2019-10-12 VITALS — BP 109/74 | HR 105 | Ht 69.0 in | Wt 186.6 lb

## 2019-10-12 DIAGNOSIS — E782 Mixed hyperlipidemia: Secondary | ICD-10-CM

## 2019-10-12 DIAGNOSIS — E559 Vitamin D deficiency, unspecified: Secondary | ICD-10-CM | POA: Diagnosis not present

## 2019-10-12 DIAGNOSIS — E1065 Type 1 diabetes mellitus with hyperglycemia: Secondary | ICD-10-CM | POA: Diagnosis not present

## 2019-10-12 DIAGNOSIS — I10 Essential (primary) hypertension: Secondary | ICD-10-CM

## 2019-10-12 LAB — POCT GLYCOSYLATED HEMOGLOBIN (HGB A1C): Hemoglobin A1C: 7.6 % — AB (ref 4.0–5.6)

## 2019-10-12 NOTE — Patient Instructions (Signed)
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
## 2019-11-12 ENCOUNTER — Other Ambulatory Visit: Payer: Self-pay | Admitting: "Endocrinology

## 2019-12-10 ENCOUNTER — Other Ambulatory Visit: Payer: Self-pay | Admitting: Pain Medicine

## 2019-12-10 ENCOUNTER — Other Ambulatory Visit (HOSPITAL_COMMUNITY): Payer: Self-pay | Admitting: Pain Medicine

## 2019-12-10 DIAGNOSIS — M545 Low back pain, unspecified: Secondary | ICD-10-CM

## 2019-12-19 ENCOUNTER — Ambulatory Visit (HOSPITAL_BASED_OUTPATIENT_CLINIC_OR_DEPARTMENT_OTHER)
Admission: RE | Admit: 2019-12-19 | Discharge: 2019-12-19 | Disposition: A | Discharge status: Home / Self Care | Payer: 59 | Source: Ambulatory Visit | Attending: Pain Medicine | Admitting: Pain Medicine

## 2019-12-19 ENCOUNTER — Other Ambulatory Visit: Payer: Self-pay

## 2019-12-19 DIAGNOSIS — M545 Low back pain, unspecified: Secondary | ICD-10-CM

## 2020-02-11 ENCOUNTER — Ambulatory Visit: Payer: 59 | Admitting: "Endocrinology

## 2020-02-11 LAB — COMPLETE METABOLIC PANEL WITH GFR
AG Ratio: 1.8 (calc) (ref 1.0–2.5)
ALT: 20 U/L (ref 9–46)
AST: 16 U/L (ref 10–40)
Albumin: 4.4 g/dL (ref 3.6–5.1)
Alkaline phosphatase (APISO): 66 U/L (ref 36–130)
BUN: 17 mg/dL (ref 7–25)
CO2: 28 mmol/L (ref 20–32)
Calcium: 9.4 mg/dL (ref 8.6–10.3)
Chloride: 97 mmol/L — ABNORMAL LOW (ref 98–110)
Creat: 1.12 mg/dL (ref 0.60–1.35)
GFR, Est African American: 97 mL/min/{1.73_m2} (ref >=60)
GFR, Est Non African American: 83 mL/min/{1.73_m2} (ref >=60)
Globulin: 2.4 g/dL (calc) (ref 1.9–3.7)
Glucose, Bld: 385 mg/dL — ABNORMAL HIGH (ref 65–99)
Potassium: 4.5 mmol/L (ref 3.5–5.3)
Sodium: 137 mmol/L (ref 135–146)
Total Bilirubin: 0.6 mg/dL (ref 0.2–1.2)
Total Protein: 6.8 g/dL (ref 6.1–8.1)

## 2020-02-11 LAB — T4, FREE: Free T4: 1.4 ng/dL (ref 0.8–1.8)

## 2020-02-11 LAB — LIPID PANEL
Cholesterol: 132 mg/dL (ref <200)
HDL: 41 mg/dL (ref >=40)
LDL Cholesterol (Calc): 75 mg/dL (calc)
Non-HDL Cholesterol (Calc): 91 mg/dL (calc) (ref <130)
Total CHOL/HDL Ratio: 3.2 (calc) (ref <5.0)
Triglycerides: 77 mg/dL (ref <150)

## 2020-02-11 LAB — MICROALBUMIN / CREATININE URINE RATIO
Creatinine, Urine: 52 mg/dL (ref 20–320)
Microalb Creat Ratio: 10 mcg/mg creat (ref <30)
Microalb, Ur: 0.5 mg/dL

## 2020-02-11 LAB — TSH: TSH: 1.5 mIU/L (ref 0.40–4.50)

## 2020-02-17 ENCOUNTER — Other Ambulatory Visit: Payer: Self-pay | Admitting: "Endocrinology

## 2020-02-19 ENCOUNTER — Other Ambulatory Visit: Payer: Self-pay

## 2020-02-19 ENCOUNTER — Telehealth: Payer: Self-pay | Admitting: "Endocrinology

## 2020-02-19 DIAGNOSIS — E1065 Type 1 diabetes mellitus with hyperglycemia: Secondary | ICD-10-CM

## 2020-02-19 MED ORDER — PEN NEEDLES 32G X 4 MM MISC: 1.0000 [IU] | Freq: Four times a day (QID) | 1 refills | Status: DC

## 2020-02-19 NOTE — Telephone Encounter (Signed)
Pt needs a refill on his pen needles for his Lantus. He said they told him it was denied. But I do not see that. [place] [facility]

## 2020-02-19 NOTE — Telephone Encounter (Signed)
Sent in

## 2020-03-02 ENCOUNTER — Other Ambulatory Visit: Payer: Self-pay | Admitting: Physician Assistant

## 2020-03-02 ENCOUNTER — Other Ambulatory Visit (HOSPITAL_COMMUNITY): Payer: Self-pay | Admitting: Physician Assistant

## 2020-03-02 DIAGNOSIS — M546 Pain in thoracic spine: Secondary | ICD-10-CM

## 2020-03-02 DIAGNOSIS — M4325 Fusion of spine, thoracolumbar region: Secondary | ICD-10-CM

## 2020-03-02 DIAGNOSIS — M545 Low back pain, unspecified: Secondary | ICD-10-CM

## 2020-03-10 ENCOUNTER — Other Ambulatory Visit: Payer: Self-pay

## 2020-03-10 ENCOUNTER — Encounter: Payer: Self-pay | Admitting: "Endocrinology

## 2020-03-10 ENCOUNTER — Ambulatory Visit (INDEPENDENT_AMBULATORY_CARE_PROVIDER_SITE_OTHER): Payer: 59 | Admitting: "Endocrinology

## 2020-03-10 VITALS — BP 100/65 | HR 88 | Ht 69.0 in | Wt 174.8 lb

## 2020-03-10 DIAGNOSIS — E1065 Type 1 diabetes mellitus with hyperglycemia: Secondary | ICD-10-CM | POA: Diagnosis not present

## 2020-03-10 LAB — POCT GLYCOSYLATED HEMOGLOBIN (HGB A1C): Hemoglobin A1C: 7.8 % — AB (ref 4.0–5.6)

## 2020-03-10 NOTE — Patient Instructions (Signed)
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
## 2020-03-15 ENCOUNTER — Other Ambulatory Visit: Payer: Self-pay

## 2020-03-15 ENCOUNTER — Ambulatory Visit (HOSPITAL_COMMUNITY)
Admission: RE | Admit: 2020-03-15 | Discharge: 2020-03-15 | Disposition: A | Discharge status: Home / Self Care | Payer: 59 | Source: Ambulatory Visit | Attending: Physician Assistant | Admitting: Physician Assistant

## 2020-03-15 DIAGNOSIS — M4325 Fusion of spine, thoracolumbar region: Secondary | ICD-10-CM

## 2020-03-15 DIAGNOSIS — M545 Low back pain, unspecified: Secondary | ICD-10-CM

## 2020-03-15 DIAGNOSIS — M546 Pain in thoracic spine: Secondary | ICD-10-CM

## 2020-04-12 ENCOUNTER — Other Ambulatory Visit: Payer: Self-pay | Admitting: "Endocrinology

## 2020-05-12 ENCOUNTER — Other Ambulatory Visit: Payer: Self-pay | Admitting: "Endocrinology

## 2020-06-06 ENCOUNTER — Other Ambulatory Visit: Payer: Self-pay | Admitting: "Endocrinology

## 2020-06-06 DIAGNOSIS — E1065 Type 1 diabetes mellitus with hyperglycemia: Secondary | ICD-10-CM

## 2020-06-07 NOTE — Telephone Encounter (Signed)
Rx sent 

## 2020-06-07 NOTE — Telephone Encounter (Signed)
He has been using Lantus between 27-32 units nightly. You may refill it at 30 units nightly.

## 2020-06-07 NOTE — Telephone Encounter (Signed)
Please advise if pt is to be on lantus. At his last visit in 02/2020 it states to continue his humalog. Pt's med list shows lantus was refilled in 2020 for 32 units nightly and pt's visit note from 09/2019 documents lantus 27 units nightly.

## 2020-07-12 ENCOUNTER — Other Ambulatory Visit: Payer: Self-pay | Admitting: "Endocrinology

## 2020-07-12 ENCOUNTER — Encounter: Payer: Self-pay | Admitting: Nurse Practitioner

## 2020-07-12 ENCOUNTER — Ambulatory Visit (INDEPENDENT_AMBULATORY_CARE_PROVIDER_SITE_OTHER): Payer: 59 | Admitting: Nurse Practitioner

## 2020-07-12 ENCOUNTER — Other Ambulatory Visit: Payer: Self-pay

## 2020-07-12 VITALS — BP 106/71 | HR 103 | Ht 69.0 in | Wt 179.0 lb

## 2020-07-12 DIAGNOSIS — E782 Mixed hyperlipidemia: Secondary | ICD-10-CM

## 2020-07-12 DIAGNOSIS — E1065 Type 1 diabetes mellitus with hyperglycemia: Secondary | ICD-10-CM | POA: Diagnosis not present

## 2020-07-12 DIAGNOSIS — E559 Vitamin D deficiency, unspecified: Secondary | ICD-10-CM

## 2020-07-12 DIAGNOSIS — I10 Essential (primary) hypertension: Secondary | ICD-10-CM | POA: Diagnosis not present

## 2020-07-12 LAB — POCT GLYCOSYLATED HEMOGLOBIN (HGB A1C): HbA1c, POC (controlled diabetic range): 8.1 % — AB (ref 0.0–7.0)

## 2020-07-12 NOTE — Patient Instructions (Signed)
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
## 2020-08-23 ENCOUNTER — Other Ambulatory Visit: Payer: Self-pay | Admitting: "Endocrinology

## 2020-10-08 IMAGING — CT CT T SPINE W/O CM
2 of 3 series · 11 of 33 positions shown, 13 images · non-contrast
Comparison: Thoracolumbar radiographs 09/01/2018. Lumbar spine MRI
12/19/2019.

CLINICAL DATA: Thoracic and low back pain status post thoracolumbar
fusion.

EXAM:
CT THORACIC AND LUMBAR SPINE WITHOUT CONTRAST
TECHNIQUE: Multidetector CT imaging of the thoracic and lumbar spine was
performed without contrast. Multiplanar CT image reconstructions
were also generated.

[Series 4: t spine soft · axial · 0.43mm/px · z∈[+720,+1016]mm · 8 of 176 slices shown, 10 images]
[im 14/176  soft-tissue]
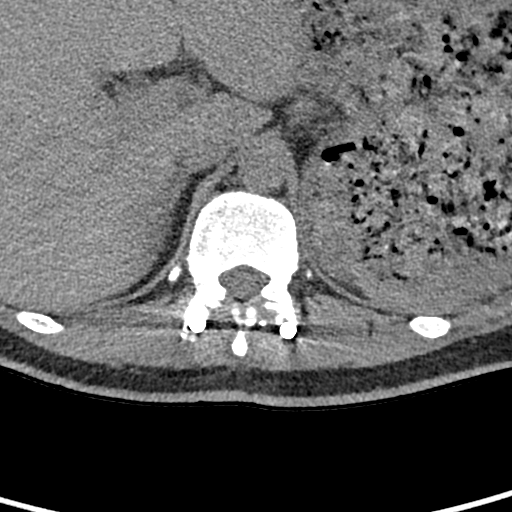
[im 14/176  bone]
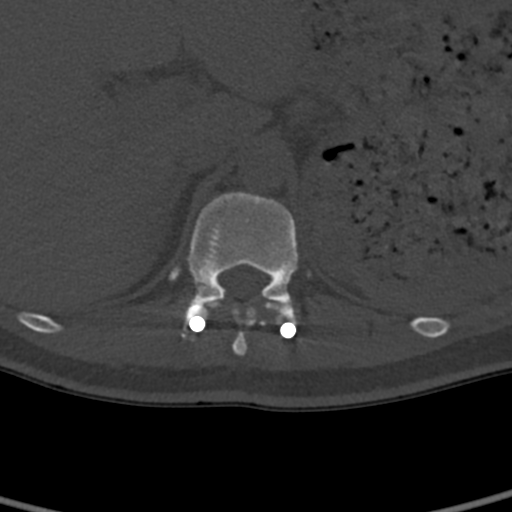
[im 41/176  bone]
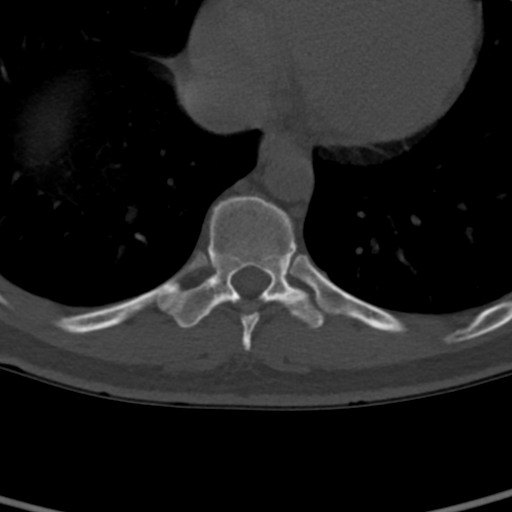
[im 54/176  bone]
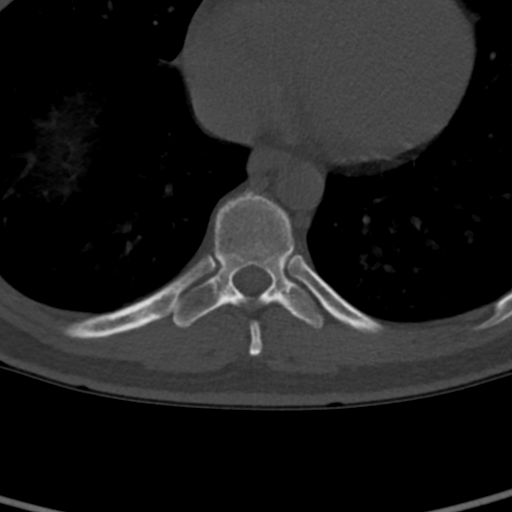
[im 81/176  bone]
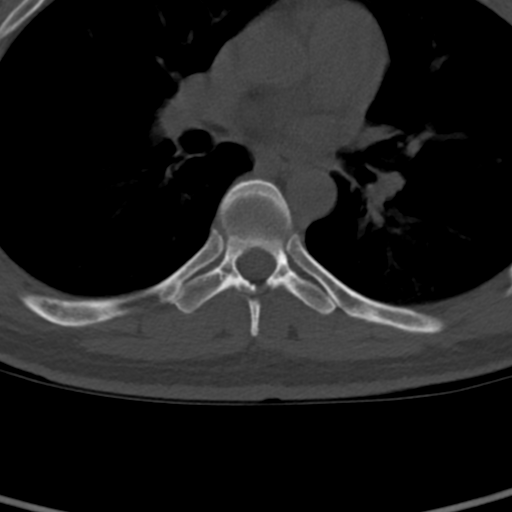
[im 95/176  soft-tissue]
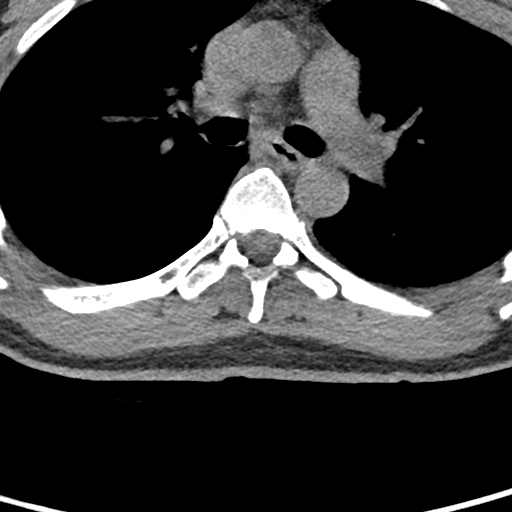
[im 95/176  bone]
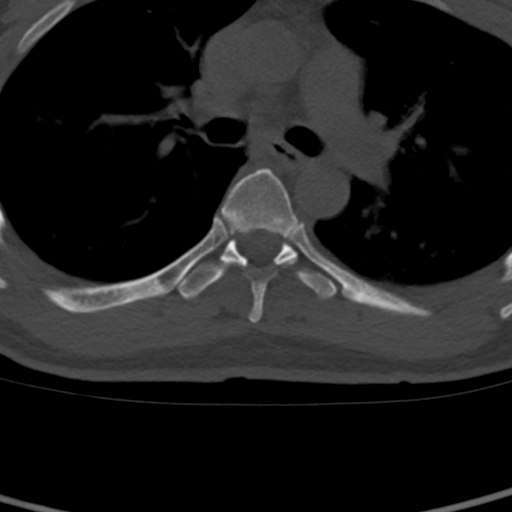
[im 122/176  bone]
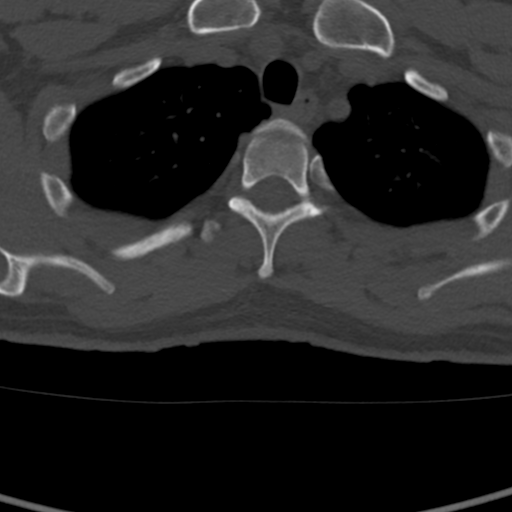
[im 135/176  bone]
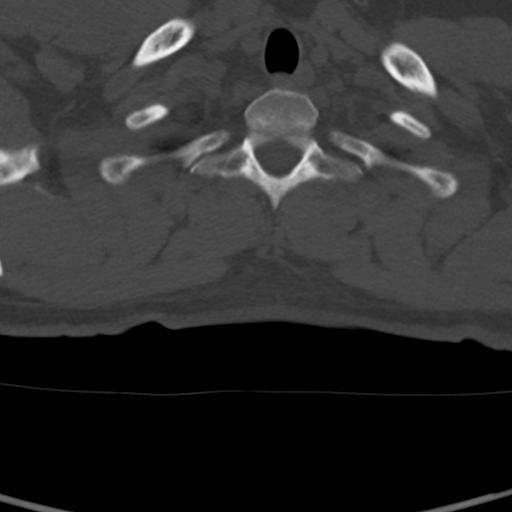
[im 162/176  bone]
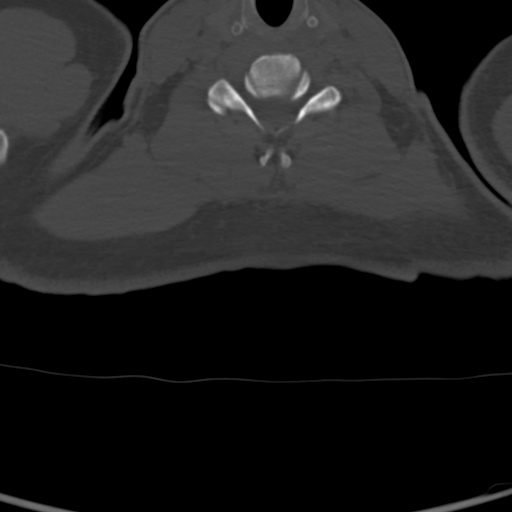

[Series 6: cor bone · coronal · 0.26mm/px · 3 of 76 slices shown]
[im 16/76  bone]
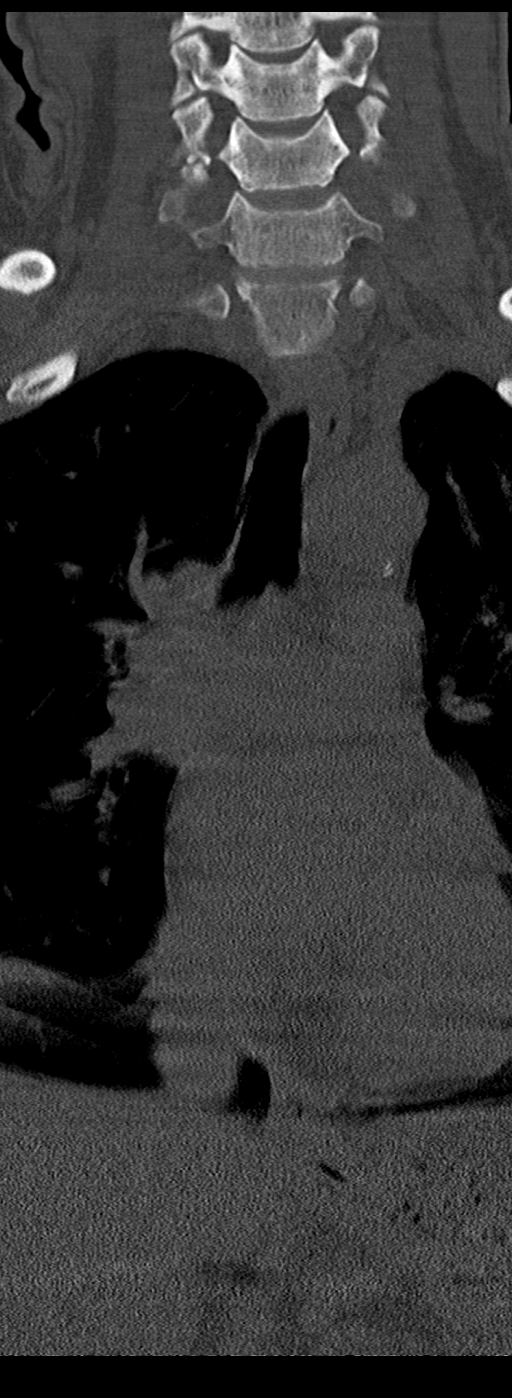
[im 31/76  bone]
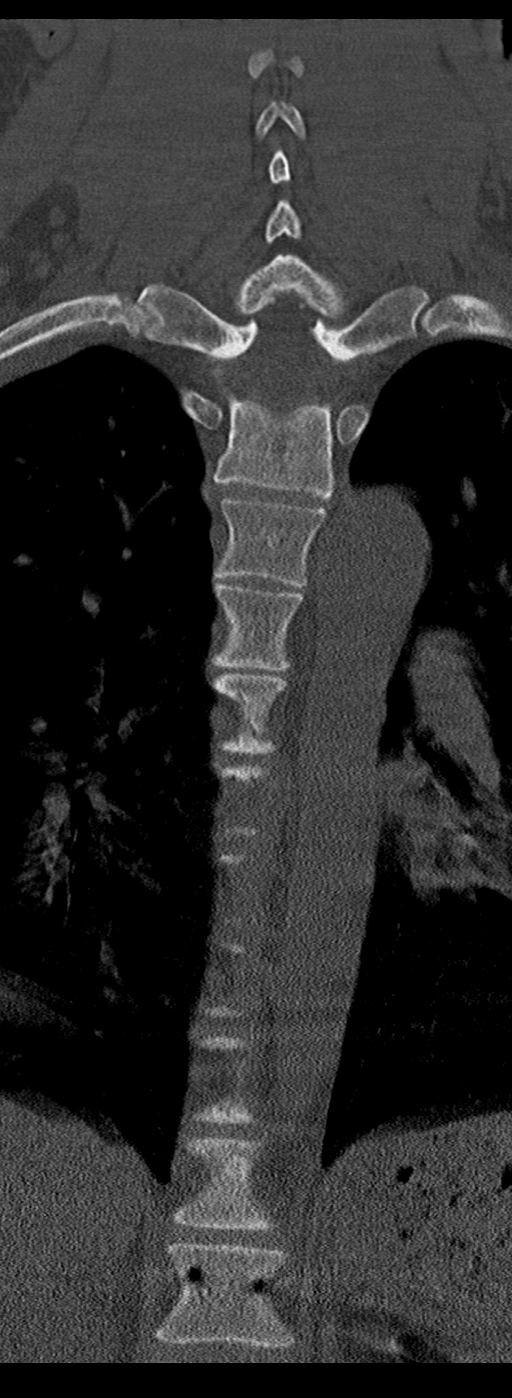
[im 46/76  bone]
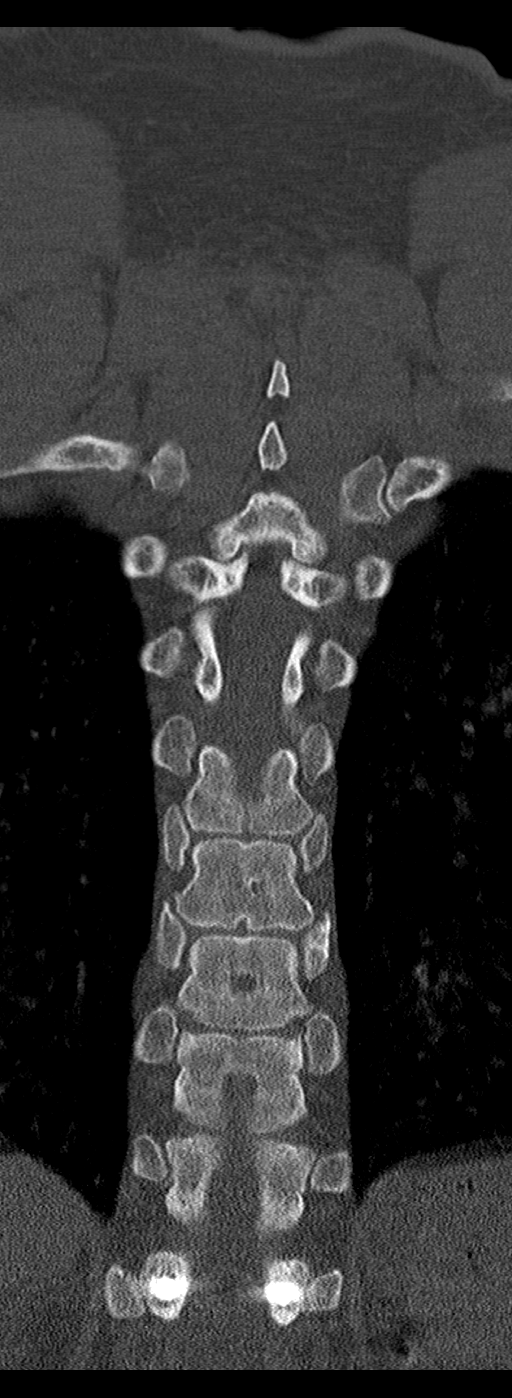

[11 of 33 positions shown; findings below may reference images not displayed]

FINDINGS: CT THORACIC SPINE FINDINGS

Alignment: Mild upper thoracic levoscoliosis.  No listhesis.

Vertebrae: No acute osseous abnormality or suspicious osseous
lesion. Thoracolumbar fusion reported below in the lumbar spine
section.

Paraspinal and other soft tissues: Unremarkable.

Disc levels: Minimal spondylosis in the thoracic and lower cervical
spine without evidence of significant stenosis on this non
myelographic examination.

CT LUMBAR SPINE FINDINGS

Segmentation: 5 lumbar type vertebrae.

Alignment: Normal.

Vertebrae: Unchanged chronic L2 superior endplate compression
fracture with mild anterior vertebral body height loss. No acute
fracture or suspicious osseous lesion. Posterior thoracolumbar
fusion with pedicle screws in place bilaterally from T12-L3. Screws
appear well-positioned without evidence of loosening. Some bridging
bone across the posterior elements at L2-3 without osseous fusion at
the other levels.

Paraspinal and other soft tissues: Left pelvic kidney.

Disc levels:

T12-L1: Posterior fusion.  No stenosis.

L1-2: Posterior decompression and fusion. Small partially calcified
central disc extrusion without stenosis, unchanged.

L2-3: Posterior fusion.  No stenosis.

L3-4: Negative.

L4-5: Unchanged left far lateral disc osteophyte complex without
evidence of stenosis.

L5-S1: Broad central disc protrusion and minimal disc bulging
without evidence of significant stenosis, unchanged.
IMPRESSION: 1. T12-L3 posterior fusion without evidence of hardware
complication.
2. Chronic L2 compression fracture.
3. Unchanged small lumbar disc herniations without evidence of
significant stenosis.
4. Minimal thoracic spondylosis without stenosis.

## 2020-10-10 ENCOUNTER — Ambulatory Visit: Payer: 59 | Admitting: Nurse Practitioner

## 2020-10-25 ENCOUNTER — Other Ambulatory Visit: Payer: Self-pay | Admitting: Nurse Practitioner

## 2020-10-25 DIAGNOSIS — E1065 Type 1 diabetes mellitus with hyperglycemia: Secondary | ICD-10-CM

## 2020-11-30 NOTE — Telephone Encounter (Signed)
error 

## 2020-12-21 ENCOUNTER — Telehealth: Payer: Self-pay | Admitting: Nurse Practitioner

## 2020-12-21 DIAGNOSIS — E1065 Type 1 diabetes mellitus with hyperglycemia: Secondary | ICD-10-CM

## 2020-12-21 MED ORDER — LANTUS SOLOSTAR 100 UNIT/ML ~~LOC~~ SOPN: 27.0000 [IU] | PEN_INJECTOR | Freq: Every day | SUBCUTANEOUS | 0 refills | Status: DC

## 2020-12-21 MED ORDER — PEN NEEDLES 32G X 4 MM MISC: 1.0000 [IU] | Freq: Four times a day (QID) | 0 refills | Status: DC

## 2020-12-21 NOTE — Telephone Encounter (Signed)
rx sent

## 2020-12-21 NOTE — Telephone Encounter (Signed)
He can have enough for 1 month, until he can be seen in the office again.

## 2020-12-21 NOTE — Telephone Encounter (Signed)
Pt last seen 06/2020. Made appt for 01/12/21. Requesting refill? No labs since Aug 2021.

## 2020-12-21 NOTE — Telephone Encounter (Signed)
Pt is calling and requesting a refill on   insulin glargine (LANTUS SOLOSTAR) 100 UNIT/ML Solostar Pen   [facility] [place], [place] - [address] [place], [address] Phone:  [phone]  Fax:  [phone]      And Insulin Pen Needle (PEN NEEDLES) 32G X 4 MM MISC  [facility] - [place], [place] - [address] Phone:  [phone]  Fax:  [phone]       Pt states he has not been able to make it into his appts because he does not have license to drive and is having to depend on someone else. He is on the schedule for 6/23

## 2021-01-12 ENCOUNTER — Ambulatory Visit: Payer: 59 | Admitting: Nurse Practitioner

## 2021-01-12 ENCOUNTER — Other Ambulatory Visit: Payer: Self-pay

## 2021-01-12 ENCOUNTER — Encounter: Payer: Self-pay | Admitting: Nurse Practitioner

## 2021-01-12 VITALS — BP 141/96 | HR 109 | Ht 69.0 in | Wt 145.0 lb

## 2021-01-12 DIAGNOSIS — E559 Vitamin D deficiency, unspecified: Secondary | ICD-10-CM | POA: Diagnosis not present

## 2021-01-12 DIAGNOSIS — E782 Mixed hyperlipidemia: Secondary | ICD-10-CM | POA: Diagnosis not present

## 2021-01-12 DIAGNOSIS — I10 Essential (primary) hypertension: Secondary | ICD-10-CM

## 2021-01-12 DIAGNOSIS — E1065 Type 1 diabetes mellitus with hyperglycemia: Secondary | ICD-10-CM

## 2021-01-12 LAB — POCT UA - MICROALBUMIN
Albumin/Creatinine Ratio, Urine, POC: <30
Creatinine, POC: 10 mg/dL
Microalbumin Ur, POC: 10 mg/L

## 2021-01-12 LAB — POCT GLYCOSYLATED HEMOGLOBIN (HGB A1C): Hemoglobin A1C: 7.8 % — AB (ref 4.0–5.6)

## 2021-01-12 MED ORDER — LANTUS SOLOSTAR 100 UNIT/ML ~~LOC~~ SOPN: 22.0000 [IU] | PEN_INJECTOR | Freq: Every day | SUBCUTANEOUS | 3 refills | Status: DC

## 2021-01-12 MED ORDER — INSULIN LISPRO 100 UNIT/ML ~~LOC~~ SOLN: 3.0000 [IU] | Freq: Three times a day (TID) | SUBCUTANEOUS | 3 refills | Status: DC

## 2021-01-12 MED ORDER — ONETOUCH VERIO VI STRP: ORAL_STRIP | 12 refills | Status: DC
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
## 2021-01-12 NOTE — Patient Instructions (Signed)

## 2021-02-24 ENCOUNTER — Other Ambulatory Visit: Payer: Self-pay | Admitting: Nurse Practitioner

## 2021-02-24 DIAGNOSIS — E1065 Type 1 diabetes mellitus with hyperglycemia: Secondary | ICD-10-CM

## 2021-04-07 ENCOUNTER — Telehealth: Payer: Self-pay

## 2021-04-07 DIAGNOSIS — E1065 Type 1 diabetes mellitus with hyperglycemia: Secondary | ICD-10-CM

## 2021-04-07 MED ORDER — ONETOUCH VERIO VI STRP: ORAL_STRIP | 12 refills | Status: DC

## 2021-04-07 NOTE — Telephone Encounter (Signed)
Patient is testing 8x a day because of the changes with his insulin. [facility] [address] [place]. He is asking for a refill and is wanting it to say 8x a day. Advised pt that he is suppose to be only testing 4x a day.  He is asking for a clinic code for his verio meter. What is he referring to? He is asking for a call back. [phone]

## 2021-04-07 NOTE — Telephone Encounter (Signed)
Left a VM to return call 

## 2021-04-07 NOTE — Telephone Encounter (Signed)
The clinic code is  [facility] for the verio [facility] set that up).  I will send in for more strips for his meter.

## 2021-04-11 LAB — TSH: TSH: 2.93 u[IU]/mL (ref 0.450–4.500)

## 2021-04-11 LAB — COMPREHENSIVE METABOLIC PANEL
ALT: 34 IU/L (ref 0–44)
AST: 38 IU/L (ref 0–40)
Albumin/Globulin Ratio: 2.2 (ref 1.2–2.2)
Albumin: 5.1 g/dL — ABNORMAL HIGH (ref 4.0–5.0)
Alkaline Phosphatase: 75 IU/L (ref 44–121)
BUN/Creatinine Ratio: 18 (ref 9–20)
BUN: 17 mg/dL (ref 6–20)
Bilirubin Total: 0.5 mg/dL (ref 0.0–1.2)
CO2: 30 mmol/L — ABNORMAL HIGH (ref 20–29)
Calcium: 10.4 mg/dL — ABNORMAL HIGH (ref 8.7–10.2)
Chloride: 100 mmol/L (ref 96–106)
Creatinine, Ser: 0.95 mg/dL (ref 0.76–1.27)
Globulin, Total: 2.3 g/dL (ref 1.5–4.5)
Glucose: 55 mg/dL — ABNORMAL LOW (ref 65–99)
Potassium: 4.2 mmol/L (ref 3.5–5.2)
Sodium: 144 mmol/L (ref 134–144)
Total Protein: 7.4 g/dL (ref 6.0–8.5)
eGFR: 104 mL/min/{1.73_m2} (ref >59)

## 2021-04-11 LAB — T4, FREE: Free T4: 1.55 ng/dL (ref 0.82–1.77)

## 2021-04-11 LAB — LIPID PANEL
Chol/HDL Ratio: 2.6 ratio (ref 0.0–5.0)
Cholesterol, Total: 135 mg/dL (ref 100–199)
HDL: 52 mg/dL (ref >39)
LDL Chol Calc (NIH): 73 mg/dL (ref 0–99)
Triglycerides: 42 mg/dL (ref 0–149)
VLDL Cholesterol Cal: 10 mg/dL (ref 5–40)

## 2021-04-11 LAB — VITAMIN D 25 HYDROXY (VIT D DEFICIENCY, FRACTURES): Vit D, 25-Hydroxy: 216.8 ng/mL — ABNORMAL HIGH (ref 30.0–100.0)

## 2021-04-17 NOTE — Telephone Encounter (Signed)
Made pt aware

## 2021-04-20 ENCOUNTER — Other Ambulatory Visit: Payer: Self-pay

## 2021-04-20 ENCOUNTER — Encounter: Payer: Self-pay | Admitting: Nurse Practitioner

## 2021-04-20 ENCOUNTER — Other Ambulatory Visit: Payer: Self-pay | Admitting: Nurse Practitioner

## 2021-04-20 ENCOUNTER — Ambulatory Visit: Payer: 59 | Admitting: Nurse Practitioner

## 2021-04-20 VITALS — BP 126/89 | HR 93 | Ht 69.0 in | Wt 142.0 lb

## 2021-04-20 DIAGNOSIS — E782 Mixed hyperlipidemia: Secondary | ICD-10-CM | POA: Diagnosis not present

## 2021-04-20 DIAGNOSIS — E1065 Type 1 diabetes mellitus with hyperglycemia: Secondary | ICD-10-CM

## 2021-04-20 DIAGNOSIS — I10 Essential (primary) hypertension: Secondary | ICD-10-CM | POA: Diagnosis not present

## 2021-04-20 DIAGNOSIS — E559 Vitamin D deficiency, unspecified: Secondary | ICD-10-CM | POA: Diagnosis not present

## 2021-04-20 LAB — POCT GLYCOSYLATED HEMOGLOBIN (HGB A1C): Hemoglobin A1C: 7.2 % — AB (ref 4.0–5.6)

## 2021-04-20 MED ORDER — LANTUS SOLOSTAR 100 UNIT/ML ~~LOC~~ SOPN: 12.0000 [IU] | PEN_INJECTOR | Freq: Every day | SUBCUTANEOUS | 2 refills | Status: DC

## 2021-04-20 NOTE — Patient Instructions (Signed)
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
## 2021-06-19 ENCOUNTER — Telehealth: Payer: Self-pay | Admitting: Nurse Practitioner

## 2021-06-19 DIAGNOSIS — E1065 Type 1 diabetes mellitus with hyperglycemia: Secondary | ICD-10-CM

## 2021-06-19 MED ORDER — ONETOUCH VERIO VI STRP: ORAL_STRIP | 12 refills | Status: DC

## 2021-06-19 NOTE — Telephone Encounter (Signed)
Patient is stating he needs a new RX for his test strips because he is running out too soon. He said he has another 2 1/2 weeks before he can refill again and he is out.

## 2021-06-21 ENCOUNTER — Other Ambulatory Visit: Payer: Self-pay

## 2021-06-21 DIAGNOSIS — E1065 Type 1 diabetes mellitus with hyperglycemia: Secondary | ICD-10-CM

## 2021-06-21 MED ORDER — ONETOUCH VERIO VI STRP: ORAL_STRIP | 2 refills | Status: DC

## 2021-06-23 ENCOUNTER — Other Ambulatory Visit: Payer: Self-pay | Admitting: Nurse Practitioner

## 2021-06-23 DIAGNOSIS — E1065 Type 1 diabetes mellitus with hyperglycemia: Secondary | ICD-10-CM

## 2021-07-21 ENCOUNTER — Encounter: Payer: Self-pay | Admitting: Nurse Practitioner

## 2021-07-21 ENCOUNTER — Other Ambulatory Visit: Payer: Self-pay

## 2021-07-21 ENCOUNTER — Ambulatory Visit (INDEPENDENT_AMBULATORY_CARE_PROVIDER_SITE_OTHER): Payer: 59 | Admitting: Nurse Practitioner

## 2021-07-21 VITALS — BP 108/80 | HR 90 | Ht 69.0 in | Wt 139.6 lb

## 2021-07-21 DIAGNOSIS — I10 Essential (primary) hypertension: Secondary | ICD-10-CM

## 2021-07-21 DIAGNOSIS — E782 Mixed hyperlipidemia: Secondary | ICD-10-CM

## 2021-07-21 DIAGNOSIS — E1065 Type 1 diabetes mellitus with hyperglycemia: Secondary | ICD-10-CM

## 2021-07-21 DIAGNOSIS — E559 Vitamin D deficiency, unspecified: Secondary | ICD-10-CM | POA: Diagnosis not present

## 2021-07-21 LAB — POCT GLYCOSYLATED HEMOGLOBIN (HGB A1C): HbA1c, POC (controlled diabetic range): 8.7 % — AB (ref 0.0–7.0)

## 2021-07-21 MED ORDER — PEN NEEDLES 31G X 6 MM MISC: 11 refills | Status: DC

## 2021-07-21 NOTE — Patient Instructions (Signed)
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
## 2021-07-26 ENCOUNTER — Ambulatory Visit: Payer: 59 | Admitting: Nurse Practitioner

## 2021-08-02 ENCOUNTER — Encounter: Payer: Self-pay | Admitting: Nurse Practitioner

## 2021-08-02 ENCOUNTER — Ambulatory Visit (INDEPENDENT_AMBULATORY_CARE_PROVIDER_SITE_OTHER): Payer: 59 | Admitting: Nurse Practitioner

## 2021-08-02 ENCOUNTER — Other Ambulatory Visit: Payer: Self-pay

## 2021-08-02 VITALS — BP 125/82 | HR 98 | Ht 69.0 in | Wt 130.4 lb

## 2021-08-02 DIAGNOSIS — E1065 Type 1 diabetes mellitus with hyperglycemia: Secondary | ICD-10-CM

## 2021-08-02 DIAGNOSIS — Z733 Stress, not elsewhere classified: Secondary | ICD-10-CM

## 2021-08-02 DIAGNOSIS — E559 Vitamin D deficiency, unspecified: Secondary | ICD-10-CM

## 2021-08-02 DIAGNOSIS — I10 Essential (primary) hypertension: Secondary | ICD-10-CM | POA: Diagnosis not present

## 2021-08-02 DIAGNOSIS — E1010 Type 1 diabetes mellitus with ketoacidosis without coma: Secondary | ICD-10-CM | POA: Diagnosis not present

## 2021-08-02 DIAGNOSIS — E782 Mixed hyperlipidemia: Secondary | ICD-10-CM | POA: Diagnosis not present

## 2021-08-02 MED ORDER — DEXCOM G6 SENSOR MISC: 3 refills | Status: DC

## 2021-08-02 MED ORDER — OMNIPOD 5 DEXG7G6 INTRO GEN 5 KIT: 1.0000 | PACK | Freq: Once | 0 refills | Status: AC

## 2021-08-02 MED ORDER — OMNIPOD 5 DEXG7G6 PODS GEN 5 MISC: 1.0000 "application " | 11 refills | Status: DC

## 2021-08-02 MED ORDER — DEXCOM G6 TRANSMITTER MISC: 3 refills | Status: DC
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
## 2021-08-02 NOTE — Patient Instructions (Signed)

## 2021-09-06 ENCOUNTER — Telehealth: Payer: Self-pay | Admitting: Nurse Practitioner

## 2021-09-06 DIAGNOSIS — E1065 Type 1 diabetes mellitus with hyperglycemia: Secondary | ICD-10-CM

## 2021-09-06 MED ORDER — ONETOUCH VERIO VI STRP: ORAL_STRIP | 2 refills | Status: DC

## 2021-09-06 NOTE — Telephone Encounter (Signed)
Patient left a VM stating he needs his prescription increased for his test strips as he is still using more than the prescription says. He would like it to be for 600 strips for every 90 days

## 2021-09-06 NOTE — Telephone Encounter (Signed)
Not sure insurance will cover that many, especially since he has the Dexcom, but I will try.  [facility]

## 2021-10-04 ENCOUNTER — Ambulatory Visit: Payer: 59 | Admitting: Nurse Practitioner

## 2021-10-10 ENCOUNTER — Ambulatory Visit: Payer: 59 | Admitting: Nurse Practitioner

## 2021-10-10 ENCOUNTER — Encounter: Payer: Self-pay | Admitting: Nurse Practitioner

## 2021-10-10 ENCOUNTER — Other Ambulatory Visit: Payer: Self-pay

## 2021-10-10 VITALS — BP 121/86 | HR 71 | Ht 69.0 in | Wt 135.6 lb

## 2021-10-10 DIAGNOSIS — E1065 Type 1 diabetes mellitus with hyperglycemia: Secondary | ICD-10-CM

## 2021-10-10 DIAGNOSIS — E782 Mixed hyperlipidemia: Secondary | ICD-10-CM | POA: Diagnosis not present

## 2021-10-10 DIAGNOSIS — E559 Vitamin D deficiency, unspecified: Secondary | ICD-10-CM

## 2021-10-10 DIAGNOSIS — I10 Essential (primary) hypertension: Secondary | ICD-10-CM | POA: Diagnosis not present

## 2021-10-10 LAB — POCT GLYCOSYLATED HEMOGLOBIN (HGB A1C): HbA1c, POC (controlled diabetic range): 7.2 % — AB (ref 0.0–7.0)

## 2021-10-10 MED ORDER — INSULIN LISPRO 100 UNIT/ML ~~LOC~~ SOLN: 40.0000 [IU] | Freq: Every day | SUBCUTANEOUS | 3 refills | Status: DC
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
## 2021-11-29 ENCOUNTER — Other Ambulatory Visit: Payer: Self-pay

## 2021-11-29 ENCOUNTER — Telehealth: Payer: Self-pay | Admitting: Nurse Practitioner

## 2021-11-29 MED ORDER — INSULIN LISPRO 100 UNIT/ML ~~LOC~~ SOLN: 40.0000 [IU] | Freq: Every day | SUBCUTANEOUS | 1 refills | Status: DC

## 2021-11-29 NOTE — Telephone Encounter (Signed)
Can you send a new RX for his humalog to [facility] for 90 day supply? The mail order is costing too much ?

## 2021-11-29 NOTE — Telephone Encounter (Signed)
Rx sent to [facility] ? ?

## 2021-11-30 ENCOUNTER — Other Ambulatory Visit: Payer: Self-pay | Admitting: Nurse Practitioner

## 2021-12-22 ENCOUNTER — Other Ambulatory Visit: Payer: Self-pay

## 2021-12-22 MED ORDER — INSULIN LISPRO 100 UNIT/ML IJ SOLN
INTRAMUSCULAR | 1 refills | Status: DC
Start: 2021-12-22 — End: 2022-02-08

## 2022-02-08 ENCOUNTER — Encounter: Payer: Self-pay | Admitting: Nurse Practitioner

## 2022-02-08 ENCOUNTER — Ambulatory Visit: Payer: 59 | Admitting: Nurse Practitioner

## 2022-02-08 VITALS — BP 125/85 | HR 81 | Ht 69.0 in | Wt 130.0 lb

## 2022-02-08 DIAGNOSIS — I10 Essential (primary) hypertension: Secondary | ICD-10-CM | POA: Diagnosis not present

## 2022-02-08 DIAGNOSIS — E109 Type 1 diabetes mellitus without complications: Secondary | ICD-10-CM

## 2022-02-08 DIAGNOSIS — E782 Mixed hyperlipidemia: Secondary | ICD-10-CM

## 2022-02-08 DIAGNOSIS — E559 Vitamin D deficiency, unspecified: Secondary | ICD-10-CM

## 2022-02-08 LAB — POCT UA - MICROALBUMIN
Albumin/Creatinine Ratio, Urine, POC: <30
Creatinine, POC: 200 mg/dL
Microalbumin Ur, POC: 10 mg/L

## 2022-02-08 LAB — POCT GLYCOSYLATED HEMOGLOBIN (HGB A1C): HbA1c, POC (controlled diabetic range): 6.5 % (ref 0.0–7.0)

## 2022-02-08 MED ORDER — INSULIN ASPART 100 UNIT/ML IJ SOLN: INTRAMUSCULAR | 3 refills | Status: DC
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
## 2022-02-22 ENCOUNTER — Other Ambulatory Visit: Payer: Self-pay | Admitting: Nurse Practitioner

## 2022-05-01 ENCOUNTER — Other Ambulatory Visit: Payer: Self-pay | Admitting: Nurse Practitioner

## 2022-06-15 ENCOUNTER — Other Ambulatory Visit: Payer: Self-pay | Admitting: Nurse Practitioner

## 2022-06-20 ENCOUNTER — Other Ambulatory Visit: Payer: Self-pay | Admitting: Nurse Practitioner

## 2022-06-21 ENCOUNTER — Encounter: Payer: Self-pay | Admitting: Internal Medicine

## 2022-07-26 ENCOUNTER — Ambulatory Visit: Payer: 59 | Admitting: Gastroenterology

## 2022-07-26 ENCOUNTER — Encounter: Payer: Self-pay | Admitting: Gastroenterology

## 2022-07-26 VITALS — BP 143/93 | HR 66 | Temp 97.9°F | Ht 69.0 in | Wt 135.2 lb

## 2022-07-26 DIAGNOSIS — K648 Other hemorrhoids: Secondary | ICD-10-CM

## 2022-07-26 NOTE — Patient Instructions (Signed)
Continue to avoid straining. Continue Miralax daily.  Limit toilet time to 2-3 minutes at a time.  I will see you back in 2 weeks for hemorrhoid banding!  It was a pleasure to see you today. I want to create trusting relationships with patients to provide genuine, compassionate, and quality care. I value your feedback. If you receive a survey regarding your visit,  I greatly appreciate you taking time to fill this out.   Annitta Needs, PhD, ANP-BC [facility]

## 2022-07-26 NOTE — Progress Notes (Signed)
Gastroenterology Office Note    Referring Provider: Redmond School, MD Primary Care Physician:  Redmond School, MD  Primary GI: Dr. Abbey Chatters    Chief Complaint   Chief Complaint  Patient presents with   New Patient (Initial Visit)    Referred for hemorrhoids, bleeding with wiping after BM     History of Present Illness   Donald Wall is a 42 y.o. male presenting today at the request of Redmond School, MD due to rectal bleeding.   Rectal bleeding for years. Notes with wiping. Using flushable wipes for about 2 years. No pain with BM as long as taking Miralax daily. Had been on pain meds for back surgery and started Miralax at that time. Now taking Miralax daily with BM daily. No straining. Wants to stick with Miralax, as it is working well.   Multiple colonoscopies when younger around age 71. None since age 69. States he had infectious colitis. Resolved. Wants to hold off on colonoscopy if possible.   Paternal grandfather: colon cancer No FH colon polyps    Past Medical History:  Diagnosis Date   Anxiety    Arthritis    rheumatoid   Asthma    problem for one year  09   Diabetes mellitus    type 1   Fibromyalgia    GERD (gastroesophageal reflux disease)    no med   History of kidney stones    2007-2008   Hypertension    Stones in the urinary tract     Past Surgical History:  Procedure Laterality Date   ADENOIDECTOMY     BACK SURGERY     rods, screws   CARPAL TUNNEL RELEASE  2011   rt   DIRECT LARYNGOSCOPY  11/21/2011   Procedure: DIRECT LARYNGOSCOPY;  Surgeon: Ruby Cola, MD;  Location: [facility];  Service: ENT;  Laterality: Right;  Direct Laryngoscopy with Biopsy   ROTATOR CUFF REPAIR Right    SPINAL CORD STIMULATOR IMPLANT     TONSILLECTOMY     URETEROSCOPY  2007   x2 08 with litho   WISDOM TOOTH EXTRACTION      Current Outpatient Medications  Medication Sig Dispense Refill   alprazolam (XANAX) 2 MG tablet Take 2 mg by mouth 4 (four) times  daily.     atorvastatin (LIPITOR) 80 MG tablet Take 80 mg by mouth every morning.      baclofen (LIORESAL) 20 MG tablet Take 20 mg by mouth every 6 (six) hours as needed.     Continuous Blood Gluc Sensor (DEXCOM G6 SENSOR) MISC Change sensor every 10 days as directed 9 each 3   Continuous Blood Gluc Transmit (DEXCOM G6 TRANSMITTER) MISC Change transmitter every 90 days as directed. 1 each 3   fexofenadine (ALLEGRA) 180 MG tablet Take 180 mg by mouth 2 (two) times daily.      furosemide (LASIX) 20 MG tablet Take 20 mg by mouth daily.     glucose blood (ONETOUCH VERIO) test strip Use to test BG up to 6 x daily. E10.65 600 each 2   insulin aspart (NOVOLOG) 100 UNIT/ML injection Use with Omnipod for TDD around 45 units daily. 80 mL 3   Insulin Disposable Pump (OMNIPOD 5 G6 POD, GEN 5,) MISC APPLY 1 POD AS DIRECTED AND REPLACE POD EVERY 48 HOURS. 8 each 3   insulin lispro (HUMALOG) 100 UNIT/ML injection ADMINISTER 40 UNITS DAILY VIA  OMNIPOD 40 mL 1   Insulin Pen Needle (PEN NEEDLES) 31G X 6  MM MISC Use to inject insulin 4 times daily 100 each 11   polyethylene glycol powder (GLYCOLAX/MIRALAX) 17 GM/SCOOP powder Take by mouth.     SURE COMFORT INSULIN SYRINGE 31G X 5/16" 0.3 ML MISC USE AS DIRECTED WITHOHUMALOG VIAL 3-4 TIMES DAILY     tiZANidine (ZANAFLEX) 4 MG tablet Take 1 tablet (4 mg total) by mouth every 8 (eight) hours as needed for muscle spasms. 60 tablet 1   triamcinolone cream (KENALOG) 0.1 % Apply topically 2 (two) times daily.     triamcinolone ointment (KENALOG) 0.1 % Apply topically 2 (two) times daily.     zolpidem (AMBIEN) 10 MG tablet Take 10 mg by mouth at bedtime as needed.     No current facility-administered medications for this visit.    Allergies as of 07/26/2022 - Review Complete 07/26/2022  Allergen Reaction Noted   Aluminum-containing compounds  10/12/2019   Aluminum Itching and Rash 02/13/2019   Erythromycin Other (See Comments) 09/07/2019   Vancomycin Rash and  Itching 02/13/2019    Family History  Problem Relation Age of Onset   Colon cancer Paternal Grandfather    Colon polyps Neg Hx     Social History   Socioeconomic History   Marital status: Married    Spouse name: Not on file   Number of children: Not on file   Years of education: Not on file   Highest education level: Not on file  Occupational History   Not on file  Tobacco Use   Smoking status: Former    Packs/day: 1.50    Years: 4.00    Total pack years: 6.00    Types: Cigarettes    Quit date: 10/20/2010    Years since quitting: 11.7   Smokeless tobacco: Never  Vaping Use   Vaping Use: Never used  Substance and Sexual Activity   Alcohol use: Yes    Alcohol/week: 1.0 - 2.0 standard drink of alcohol    Types: 1 - 2 Cans of beer per week    Comment: per week   Drug use: Yes    Comment: hx of marijuana use 2000-2005   Sexual activity: Yes  Other Topics Concern   Not on file  Social History Narrative   Not on file   Social Determinants of Health   Financial Resource Strain: Not on file  Food Insecurity: Not on file  Transportation Needs: Not on file  Physical Activity: Not on file  Stress: Not on file  Social Connections: Not on file  Intimate Partner Violence: Not on file     Review of Systems   Gen: Denies any fever, chills, fatigue, weight loss, lack of appetite.  CV: Denies chest pain, heart palpitations, peripheral edema, syncope.  Resp: Denies shortness of breath at rest or with exertion. Denies wheezing or cough.  GI: Denies dysphagia or odynophagia. Denies jaundice, hematemesis, fecal incontinence. GU : Denies urinary burning, urinary frequency, urinary hesitancy MS: Denies joint pain, muscle weakness, cramps, or limitation of movement.  Derm: Denies rash, itching, dry skin Psych: Denies depression, anxiety, memory loss, and confusion Heme: Denies bruising, bleeding, and enlarged lymph nodes.   Physical Exam   BP (!) 143/93   Pulse 66   Temp  97.9 F (36.6 C)   Ht 5\' 9"  (1.753 m)   Wt 135 lb 3.2 oz (61.3 kg)   BMI 19.97 kg/m  General:   Alert and oriented. Pleasant and cooperative. Well-nourished and well-developed.  Head:  Normocephalic and atraumatic. Eyes:  Without  icterus Ears:  Normal auditory acuity. Rectal:  small external hemorrhoid tags, no thrombosed. No fissure. DRE without mass. Anoscopy performed with Grade 3 internal hemorrhoids, notably right anterior column most predominant. No mass appreciated.  Msk:  Symmetrical without gross deformities. Normal posture. Extremities:  Without edema. Neurologic:  Alert and  oriented x4;  grossly normal neurologically. Skin:  Intact without significant lesions or rashes. Psych:  Alert and cooperative. Normal mood and affect.   Assessment   Donald Wall is a 42 y.o. male presenting today with symptomatic Grade 3 hemorrhoids. Anoscopy performed today at time of visit as noted above.  He would like to hold off on colonoscopy unless necessary; he does report multiple in his teens and last around age 68. No first degree relates with colon cancer or polyps.  We discussed banding initially, with the caveat that colonoscopy would be recommended if no improvement, as he is wanting to hold off on colonoscopy at this time.    PLAN   Continue Miralax daily  Avoid straining  Return in 2 weeks for banding   Gelene Mink, PhD, [facility]

## 2022-08-01 LAB — COMPREHENSIVE METABOLIC PANEL
ALT: 50 IU/L — ABNORMAL HIGH (ref 0–44)
AST: 36 IU/L (ref 0–40)
Albumin/Globulin Ratio: 2 (ref 1.2–2.2)
Albumin: 4.9 g/dL (ref 4.1–5.1)
Alkaline Phosphatase: 74 IU/L (ref 44–121)
BUN/Creatinine Ratio: 19 (ref 9–20)
BUN: 18 mg/dL (ref 6–24)
Bilirubin Total: 0.3 mg/dL (ref 0.0–1.2)
CO2: 25 mmol/L (ref 20–29)
Calcium: 9.6 mg/dL (ref 8.7–10.2)
Chloride: 100 mmol/L (ref 96–106)
Creatinine, Ser: 0.94 mg/dL (ref 0.76–1.27)
Globulin, Total: 2.4 g/dL (ref 1.5–4.5)
Glucose: 96 mg/dL (ref 70–99)
Potassium: 4 mmol/L (ref 3.5–5.2)
Sodium: 141 mmol/L (ref 134–144)
Total Protein: 7.3 g/dL (ref 6.0–8.5)
eGFR: 105 mL/min/{1.73_m2} (ref >59)

## 2022-08-01 LAB — LIPID PANEL
Chol/HDL Ratio: 2.2 ratio (ref 0.0–5.0)
Cholesterol, Total: 127 mg/dL (ref 100–199)
HDL: 58 mg/dL (ref >39)
LDL Chol Calc (NIH): 59 mg/dL (ref 0–99)
Triglycerides: 38 mg/dL (ref 0–149)
VLDL Cholesterol Cal: 10 mg/dL (ref 5–40)

## 2022-08-01 LAB — T4, FREE: Free T4: 1.22 ng/dL (ref 0.82–1.77)

## 2022-08-01 LAB — TSH: TSH: 0.757 u[IU]/mL (ref 0.450–4.500)

## 2022-08-01 LAB — VITAMIN D 25 HYDROXY (VIT D DEFICIENCY, FRACTURES): Vit D, 25-Hydroxy: 71.4 ng/mL (ref 30.0–100.0)

## 2022-08-02 ENCOUNTER — Other Ambulatory Visit: Payer: Self-pay | Admitting: Nurse Practitioner

## 2022-08-09 ENCOUNTER — Ambulatory Visit: Payer: 59 | Admitting: Gastroenterology

## 2022-08-09 ENCOUNTER — Encounter: Payer: Self-pay | Admitting: Gastroenterology

## 2022-08-09 VITALS — BP 115/79 | HR 88 | Temp 97.4°F | Ht 69.0 in | Wt 133.6 lb

## 2022-08-09 DIAGNOSIS — K642 Third degree hemorrhoids: Secondary | ICD-10-CM | POA: Diagnosis not present

## 2022-08-09 NOTE — Patient Instructions (Signed)
  Please avoid straining.  You should limit your toilet time to 2-3 minutes at the most.   I recommend Benefiber 2 teaspoons each morning in the beverage of your choice!  Please call me with any concerns or issues!  I will see you in follow-up for additional banding in several weeks.    I enjoyed seeing you again today! At our first visit, I mentioned how I value our relationship and want to provide genuine, compassionate, and quality care. You may receive a survey regarding your visit with me, and I welcome your feedback! Thanks so much for taking the time to complete this. I look forward to seeing you again.   Lachell Rochette W. Anaika Santillano, PhD, ANP-BC [facility]      

## 2022-08-09 NOTE — Progress Notes (Signed)
    [place] BANDING PROCEDURE NOTE  ANTHONE PRIEUR is a 42 y.o. male presenting today for consideration of hemorrhoid banding. REports many colonoscopies in his teens; desiring to avoid colonoscopy if possible but with the caveat to pursue colonoscopy if no improvement in rectal bleeding. Initial visit on 07/26/22 with anoscopy revealing  Grade 3 internal hemorrhoids, notably right anterior column most predominant. No mass appreciated.     The patient presents with symptomatic grade 3 hemorrhoids, unresponsive to maximal medical therapy, requesting rubber band ligation of his hemorrhoidal disease. All risks, benefits, and alternative forms of therapy were described and informed consent was obtained.   The decision was made to band the right anterior internal hemorrhoid, and the [place] was used to perform band ligation without complication. Digital anorectal examination was then performed to assure proper positioning of the band, and to adjust the banded tissue as required. The patient was discharged home without pain or other issues. Dietary and behavioral recommendations were given, along with follow-up instructions. The patient will return in several weeks for followup and possible additional banding as required.We will discuss colonoscopy once banding is completed, as he is willing to do this once banding done.   No complications were encountered and the patient tolerated the procedure well.   Annitta Needs, PhD, ANP-BC [facility]

## 2022-08-10 ENCOUNTER — Ambulatory Visit: Payer: 59 | Admitting: Nurse Practitioner

## 2022-08-10 ENCOUNTER — Encounter: Payer: Self-pay | Admitting: Nurse Practitioner

## 2022-08-10 VITALS — BP 135/89 | HR 88 | Ht 69.0 in | Wt 133.0 lb

## 2022-08-10 DIAGNOSIS — I10 Essential (primary) hypertension: Secondary | ICD-10-CM | POA: Diagnosis not present

## 2022-08-10 DIAGNOSIS — E109 Type 1 diabetes mellitus without complications: Secondary | ICD-10-CM

## 2022-08-10 DIAGNOSIS — E559 Vitamin D deficiency, unspecified: Secondary | ICD-10-CM | POA: Diagnosis not present

## 2022-08-10 DIAGNOSIS — E782 Mixed hyperlipidemia: Secondary | ICD-10-CM | POA: Diagnosis not present

## 2022-08-10 LAB — POCT GLYCOSYLATED HEMOGLOBIN (HGB A1C): Hemoglobin A1C: 6 % — AB (ref 4.0–5.6)

## 2022-08-10 MED ORDER — INSULIN LISPRO 100 UNIT/ML IJ SOLN: INTRAMUSCULAR | 3 refills | Status: DC

## 2022-08-10 MED ORDER — DEXCOM G6 SENSOR MISC: 3 refills | Status: DC

## 2022-08-10 MED ORDER — OMNIPOD 5 DEXG7G6 PODS GEN 5 MISC: 3 refills | Status: DC

## 2022-08-10 MED ORDER — DEXCOM G6 TRANSMITTER MISC: 3 refills | Status: DC
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
## 2022-08-21 ENCOUNTER — Encounter: Payer: 59 | Admitting: Gastroenterology

## 2022-08-23 ENCOUNTER — Encounter: Payer: Self-pay | Admitting: Gastroenterology

## 2022-08-23 ENCOUNTER — Ambulatory Visit: Payer: 59 | Admitting: Gastroenterology

## 2022-08-23 VITALS — BP 109/70 | HR 74 | Temp 97.9°F | Ht 69.0 in | Wt 131.0 lb

## 2022-08-23 DIAGNOSIS — K642 Third degree hemorrhoids: Secondary | ICD-10-CM | POA: Diagnosis not present

## 2022-08-23 NOTE — Patient Instructions (Signed)
  Please avoid straining.  You should limit your toilet time to 2-3 minutes at the most.   I recommend Benefiber 2 teaspoons each morning in the beverage of your choice!  Please call me with any concerns or issues!  I will see you in follow-up for additional banding in several weeks.    I enjoyed seeing you again today! At our first visit, I mentioned how I value our relationship and want to provide genuine, compassionate, and quality care. You may receive a survey regarding your visit with me, and I welcome your feedback! Thanks so much for taking the time to complete this. I look forward to seeing you again.   Lidie Glade W. Monda Chastain, PhD, ANP-BC [facility]      

## 2022-08-23 NOTE — Progress Notes (Signed)
    [place] BANDING PROCEDURE NOTE  Donald Wall is a 42 y.o. male presenting today for consideration of hemorrhoid banding.  REports many colonoscopies in his teens; desiring to avoid colonoscopy if possible but with the caveat to pursue colonoscopy if no improvement in rectal bleeding. Initial visit on 07/26/22 with anoscopy revealing  Grade 3 internal hemorrhoids, notably right anterior column most predominant. No mass appreciated.   He has had banding of the right anterior column.   The patient presents with symptomatic grade 3 hemorrhoids, unresponsive to maximal medical therapy, requesting rubber band ligation of his hemorrhoidal disease. All risks, benefits, and alternative forms of therapy were described and informed consent was obtained.   The decision was made to band the left lateral internal hemorrhoid, and the [place] was used to perform band ligation without complication. Digital anorectal examination was then performed to assure proper positioning of the band, and to adjust the banded tissue as required. The patient was discharged home without pain or other issues. Dietary and behavioral recommendations were given, along with follow-up instructions. The patient will return in several weeks for followup and possible additional banding as required.  No complications were encountered and the patient tolerated the procedure well.   Annitta Needs, PhD, ANP-BC [facility]

## 2022-09-06 ENCOUNTER — Encounter: Payer: 59 | Admitting: Gastroenterology

## 2022-09-10 ENCOUNTER — Encounter: Payer: Self-pay | Admitting: Gastroenterology

## 2022-11-01 ENCOUNTER — Ambulatory Visit (INDEPENDENT_AMBULATORY_CARE_PROVIDER_SITE_OTHER): Payer: 59 | Admitting: Gastroenterology

## 2022-11-01 ENCOUNTER — Encounter: Payer: Self-pay | Admitting: Gastroenterology

## 2022-11-01 VITALS — BP 119/85 | HR 61 | Temp 97.1°F | Ht 69.0 in | Wt 126.7 lb

## 2022-11-01 DIAGNOSIS — K642 Third degree hemorrhoids: Secondary | ICD-10-CM

## 2022-11-01 NOTE — Patient Instructions (Signed)
  Please avoid straining.  You should limit your toilet time to 2-3 minutes at the most.   I recommend Benefiber 2 teaspoons each morning in the beverage of your choice!  Please call me with any concerns or issues!  I will see you  back as needed!  I enjoyed seeing you again today! At our first visit, I mentioned how I value our relationship and want to provide genuine, compassionate, and quality care. You may receive a survey regarding your visit with me, and I welcome your feedback! Thanks so much for taking the time to complete this. I look forward to seeing you again.   Zendaya Groseclose W. Kerim Statzer, PhD, ANP-BC [facility]          

## 2022-11-01 NOTE — Progress Notes (Signed)
    [facility] BANDING PROCEDURE NOTE  Donald Wall is a 42 y.o. male presenting today for consideration of hemorrhoid banding. REports many colonoscopies in his teens; desiring to avoid colonoscopy if possible but with the caveat to pursue colonoscopy if no improvement in rectal bleeding. Initial visit on 07/26/22 with anoscopy revealing  Grade 3 internal hemorrhoids, notably right anterior column most predominant. No mass appreciated.   He has had banding of the right anterior column and left lateral.    The patient presents with symptomatic grade 3 hemorrhoids, unresponsive to maximal medical therapy, requesting rubber band ligation of his/her hemorrhoidal disease. All risks, benefits, and alternative forms of therapy were described and informed consent was obtained.  In the left lateral decubitus position, anoscopic examination revealed grade 2-3 hemorrhoids in the right anterior position.   The decision was made to band the right anterior internal hemorrhoid, and the [facility] System was used to perform band ligation without complication. Digital anorectal examination was then performed to assure proper positioning of the band, and to adjust the banded tissue as required. The patient was discharged home without pain or other issues. Dietary and behavioral recommendations were given, along with follow-up instructions. The patient will return as needed.   No complications were encountered and the patient tolerated the procedure well.   Gelene Mink, PhD, ANP-BC [facility]

## 2023-02-04 ENCOUNTER — Other Ambulatory Visit: Payer: Self-pay | Admitting: Nurse Practitioner

## 2023-02-04 DIAGNOSIS — E1065 Type 1 diabetes mellitus with hyperglycemia: Secondary | ICD-10-CM

## 2023-02-05 LAB — COMPREHENSIVE METABOLIC PANEL
ALT: 51 IU/L — ABNORMAL HIGH (ref 0–44)
AST: 99 IU/L — ABNORMAL HIGH (ref 0–40)
Albumin: 4.7 g/dL (ref 4.1–5.1)
Alkaline Phosphatase: 54 IU/L (ref 44–121)
BUN/Creatinine Ratio: 18 (ref 9–20)
BUN: 20 mg/dL (ref 6–24)
Bilirubin Total: 0.3 mg/dL (ref 0.0–1.2)
CO2: 26 mmol/L (ref 20–29)
Calcium: 9.7 mg/dL (ref 8.7–10.2)
Chloride: 103 mmol/L (ref 96–106)
Creatinine, Ser: 1.09 mg/dL (ref 0.76–1.27)
Globulin, Total: 2.4 g/dL (ref 1.5–4.5)
Glucose: 126 mg/dL — ABNORMAL HIGH (ref 70–99)
Potassium: 4.5 mmol/L (ref 3.5–5.2)
Sodium: 142 mmol/L (ref 134–144)
Total Protein: 7.1 g/dL (ref 6.0–8.5)
eGFR: 88 mL/min/{1.73_m2} (ref >59)

## 2023-02-08 ENCOUNTER — Ambulatory Visit: Payer: 59 | Admitting: Nurse Practitioner

## 2023-02-08 ENCOUNTER — Telehealth: Payer: Self-pay

## 2023-02-08 ENCOUNTER — Other Ambulatory Visit (HOSPITAL_COMMUNITY): Payer: Self-pay

## 2023-02-08 ENCOUNTER — Telehealth: Payer: Self-pay | Admitting: Nurse Practitioner

## 2023-02-08 ENCOUNTER — Encounter: Payer: Self-pay | Admitting: Nurse Practitioner

## 2023-02-08 VITALS — BP 127/86 | HR 81 | Ht 69.0 in | Wt 131.8 lb

## 2023-02-08 DIAGNOSIS — I10 Essential (primary) hypertension: Secondary | ICD-10-CM | POA: Diagnosis not present

## 2023-02-08 DIAGNOSIS — E782 Mixed hyperlipidemia: Secondary | ICD-10-CM | POA: Diagnosis not present

## 2023-02-08 DIAGNOSIS — E559 Vitamin D deficiency, unspecified: Secondary | ICD-10-CM | POA: Diagnosis not present

## 2023-02-08 DIAGNOSIS — E109 Type 1 diabetes mellitus without complications: Secondary | ICD-10-CM

## 2023-02-08 LAB — POCT GLYCOSYLATED HEMOGLOBIN (HGB A1C): Hemoglobin A1C: 5.6 % (ref 4.0–5.6)

## 2023-02-08 MED ORDER — OMNIPOD 5 DEXG7G6 PODS GEN 5 MISC: 3 refills | Status: DC

## 2023-02-08 MED ORDER — DEXCOM G6 TRANSMITTER MISC: 3 refills | Status: AC

## 2023-02-08 MED ORDER — INSULIN ASPART 100 UNIT/ML IJ SOLN: INTRAMUSCULAR | 3 refills | Status: DC

## 2023-02-08 MED ORDER — DEXCOM G6 SENSOR MISC: 3 refills | Status: DC

## 2023-02-08 MED ORDER — DEXCOM G6 TRANSMITTER MISC: 3 refills | Status: DC

## 2023-02-08 NOTE — Telephone Encounter (Signed)
Pharmacy Patient Advocate Encounter   Received notification from Pt Calls Messages that prior authorization for Novolog is required/requested.   Insurance verification completed.   The patient is insured through [facility] .  Per test claim: Insurance prefers Humalog, but prescriber states that pt has adverse reactions on humalog and Novolog is the only other medication compatible with his omnipod pump.   Per test claim: PA submitted to [facility] via CoverMyMeds Key/confirmation #/EOC [id number] Status is pending
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
## 2023-02-08 NOTE — Addendum Note (Signed)
Addended by: Dani Gobble on: 02/08/2023 11:31 AM   Modules accepted: Orders

## 2023-02-10 NOTE — Telephone Encounter (Signed)
PA initiated for Novolog insulin

## 2023-02-18 ENCOUNTER — Other Ambulatory Visit (HOSPITAL_COMMUNITY): Payer: Self-pay

## 2023-02-18 NOTE — Telephone Encounter (Addendum)
Pharmacy Patient Advocate Encounter  Received notification from [facility] that Prior Authorization for Insulin Aspart has been DENIED. Please advise how you'd like to proceed. Full denial letter will be uploaded to the media tab. See denial reason below.    PA #/Case ID/Reference #: [id number]  I called to see what other insulins could be covered. She said all she could see was the Humalog, generic and lyumjev. She said the pt could call the customer # and see if they could see anything different, but I sent this on for appeal review. Information has been sent to clinical pharmacist for appeals review. It may take 5-7 days to prepare the necessary documentation to request the appeal from the insurance.

## 2023-03-14 ENCOUNTER — Encounter: Payer: Self-pay | Admitting: Nurse Practitioner

## 2023-03-14 ENCOUNTER — Telehealth: Payer: Self-pay | Admitting: *Deleted

## 2023-03-14 LAB — COMPREHENSIVE METABOLIC PANEL
Albumin: 4.8 g/dL (ref 4.1–5.1)
BUN/Creatinine Ratio: 21 — ABNORMAL HIGH (ref 9–20)
Bilirubin Total: 0.3 mg/dL (ref 0.0–1.2)
Creatinine, Ser: 1.08 mg/dL (ref 0.76–1.27)

## 2023-03-14 NOTE — Progress Notes (Signed)
Noted  

## 2023-03-14 NOTE — Telephone Encounter (Signed)
-----   Message from Dani Gobble sent at 03/14/2023  7:13 AM EDT ----- FYI: sent patient mychart message going over recent labs.

## 2023-03-14 NOTE — Telephone Encounter (Signed)
Noted that Alphonzo Lemmings has sent a message to patient in my chart.

## 2023-05-07 ENCOUNTER — Other Ambulatory Visit: Payer: Self-pay

## 2023-05-07 DIAGNOSIS — E1065 Type 1 diabetes mellitus with hyperglycemia: Secondary | ICD-10-CM

## 2023-05-07 MED ORDER — ONETOUCH VERIO VI STRP
ORAL_STRIP | 1 refills | Status: DC
Start: 2023-05-07 — End: 2023-10-15

## 2023-06-05 ENCOUNTER — Other Ambulatory Visit: Payer: Self-pay | Admitting: Nurse Practitioner

## 2023-06-05 MED ORDER — DEXCOM G7 SENSOR MISC: 1.0000 | 3 refills | Status: DC

## 2023-06-05 NOTE — Progress Notes (Signed)
Patient requested I send in Dexcom G7 sensors to [employer].  Says he is using the Omnipod App on his phone.  I did double check with Pamelia Hoit to see if it was compatible with the G7 and it is.

## 2023-09-19 ENCOUNTER — Other Ambulatory Visit: Payer: Self-pay | Admitting: Nurse Practitioner

## 2023-09-19 MED ORDER — INSULIN LISPRO 100 UNIT/ML IJ SOLN: INTRAMUSCULAR | 3 refills | Status: DC

## 2023-09-19 MED ORDER — INSULIN ASPART 100 UNIT/ML IJ SOLN: INTRAMUSCULAR | 3 refills | Status: DC

## 2023-10-14 ENCOUNTER — Other Ambulatory Visit: Payer: Self-pay | Admitting: Nurse Practitioner

## 2023-10-14 DIAGNOSIS — E1065 Type 1 diabetes mellitus with hyperglycemia: Secondary | ICD-10-CM

## 2024-01-10 ENCOUNTER — Other Ambulatory Visit: Payer: Self-pay | Admitting: Nurse Practitioner

## 2024-01-27 ENCOUNTER — Telehealth: Payer: Self-pay | Admitting: Nurse Practitioner

## 2024-01-27 NOTE — Telephone Encounter (Signed)
 Does he need new labs for this appt?

## 2024-01-27 NOTE — Telephone Encounter (Signed)
 No, not for this appt.

## 2024-02-07 ENCOUNTER — Encounter: Payer: Self-pay | Admitting: Nurse Practitioner

## 2024-02-07 ENCOUNTER — Ambulatory Visit: Payer: 59 | Admitting: Nurse Practitioner

## 2024-02-07 VITALS — BP 110/82 | HR 93 | Ht 69.0 in | Wt 145.0 lb

## 2024-02-07 DIAGNOSIS — E782 Mixed hyperlipidemia: Secondary | ICD-10-CM

## 2024-02-07 DIAGNOSIS — E559 Vitamin D deficiency, unspecified: Secondary | ICD-10-CM | POA: Diagnosis not present

## 2024-02-07 DIAGNOSIS — Z794 Long term (current) use of insulin: Secondary | ICD-10-CM

## 2024-02-07 DIAGNOSIS — I10 Essential (primary) hypertension: Secondary | ICD-10-CM | POA: Diagnosis not present

## 2024-02-07 DIAGNOSIS — E109 Type 1 diabetes mellitus without complications: Secondary | ICD-10-CM

## 2024-02-07 LAB — POCT GLYCOSYLATED HEMOGLOBIN (HGB A1C): Hemoglobin A1C: 6.3 % — AB (ref 4.0–5.6)

## 2024-02-07 MED ORDER — OMNIPOD 5 DEXG7G6 PODS GEN 5 MISC: 3 refills | Status: DC

## 2024-02-07 MED ORDER — INSULIN LISPRO 100 UNIT/ML IJ SOLN: INTRAMUSCULAR | 3 refills | Status: DC

## 2024-02-07 MED ORDER — DEXCOM G7 SENSOR MISC: 1.0000 | 3 refills | Status: AC
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
## 2024-02-12 ENCOUNTER — Encounter: Payer: Self-pay | Admitting: Neurology

## 2024-02-12 ENCOUNTER — Ambulatory Visit: Admitting: Neurology

## 2024-02-12 VITALS — BP 102/64 | HR 80 | Ht 69.0 in | Wt 147.2 lb

## 2024-02-12 DIAGNOSIS — M961 Postlaminectomy syndrome, not elsewhere classified: Secondary | ICD-10-CM

## 2024-02-12 DIAGNOSIS — E1065 Type 1 diabetes mellitus with hyperglycemia: Secondary | ICD-10-CM | POA: Diagnosis not present

## 2024-02-12 DIAGNOSIS — M5417 Radiculopathy, lumbosacral region: Secondary | ICD-10-CM

## 2024-02-12 DIAGNOSIS — M792 Neuralgia and neuritis, unspecified: Secondary | ICD-10-CM
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
## 2024-02-12 NOTE — Patient Instructions (Signed)

## 2024-02-21 ENCOUNTER — Ambulatory Visit: Payer: Self-pay | Admitting: Neurology

## 2024-02-21 ENCOUNTER — Encounter: Payer: Self-pay | Admitting: Neurology

## 2024-02-21 ENCOUNTER — Ambulatory Visit: Admitting: Neurology

## 2024-02-21 VITALS — BP 124/81 | HR 82

## 2024-02-21 DIAGNOSIS — M5417 Radiculopathy, lumbosacral region: Secondary | ICD-10-CM | POA: Diagnosis not present

## 2024-02-21 DIAGNOSIS — M961 Postlaminectomy syndrome, not elsewhere classified: Secondary | ICD-10-CM | POA: Diagnosis not present

## 2024-02-21 DIAGNOSIS — M79604 Pain in right leg: Secondary | ICD-10-CM

## 2024-02-21 DIAGNOSIS — E1065 Type 1 diabetes mellitus with hyperglycemia: Secondary | ICD-10-CM

## 2024-02-21 NOTE — Procedures (Signed)
 Full Name: Donald Wall Gender: Male MRN #: [id number] Date of Birth: 21-Aug-1980    Visit Date: 02/21/2024 10:13 Age: 43 Years Examining Physician: Onita Duos Referring Physician: Dohmeier Height: 5 feet 9 inch History: 43 year old type 1 diabetes presenting with right calf pain  Summary of the test: Nerve conduction study: Bilateral, superficial peroneal sensory responses were within low normal limit.  Bilateral peroneal to EDB, tibial motor responses were also within normal limits Electromyography: Selected needle examination of bilateral lower extremity muscles showed no significant abnormality  Conclusion: This is essentially normal study.  There is no evidence of large fiber peripheral neuropathy or active lumbosacral radiculopathy.    ------------------------------- Duos Onita. M.D. Ph.D.   [facility] [address], [address] [place], [place] [address] Tel: [phone] Fax: [phone]  Verbal informed consent was obtained from the patient, patient was informed of potential risk of procedure, including bruising, bleeding, hematoma formation, infection, muscle weakness, muscle pain, numbness, among others.        MNC    Nerve / Sites Muscle Latency Ref. Amplitude Ref. Rel Amp Segments Distance Velocity Ref. Area    ms ms mV mV %  cm m/s m/s mVms  L Peroneal - EDB     Ankle EDB 5.7 <=6.5 2.7 >=2.0 100 Ankle - EDB 9   7.7     Fib head EDB 11.9  2.4  90.5 Fib head - Ankle 27 44 >=44 9.4     Pop fossa EDB 14.0  3.9  162 Pop fossa - Fib head 9 44 >=44 15.2         Pop fossa - Ankle      R Peroneal - EDB     Ankle EDB 5.6 <=6.5 3.0 >=2.0 100 Ankle - EDB 9   9.9     Fib head EDB 12.1  2.3  79.1 Fib head - Ankle 27 42 >=44 8.2     Pop fossa EDB 15.3  3.4  145 Pop fossa - Fib head 13 40 >=44 10.8         Pop fossa - Ankle      L Tibial - AH     Ankle AH 4.2 <=5.8 16.3 >=4.0 100 Ankle - AH 9   29.7     Pop fossa AH 12.6  13.3  81.6 Pop  fossa - Ankle 42 50 >=41 27.0  R Tibial - AH     Ankle AH 4.9 <=5.8 9.0 >=4.0 100 Ankle - AH 9   18.5     Pop fossa AH 16.1  7.9  88.2 Pop fossa - Ankle 49 44 >=41 20.3               SNC    Nerve / Sites Rec. Site Peak Lat Ref.  Amp Ref. Segments Distance    ms ms V V  cm  R Sural - Ankle (Calf)     Calf Ankle 4.3 <=4.4 5 >=6 Calf - Ankle 14  L Superficial peroneal - Ankle     Lat leg Ankle 3.6 <=4.4 6 >=6 Lat leg - Ankle 14  R Superficial peroneal - Ankle     Lat leg Ankle 4.3 <=4.4 8 >=6 Lat leg - Ankle 14  R Median - Orthodromic (Dig II, Mid palm)     Dig II Wrist 4.0 <=3.4 4 >=10 Dig II - Wrist 13  F  Wave    Nerve F Lat Ref.   ms ms  L Tibial - AH 50.3 <=56.0  R Tibial - AH 52.5 <=56.0         EMG Summary Table    Spontaneous MUAP Recruitment  Muscle IA Fib PSW Fasc Other Amp Dur. Poly Pattern  R. Tibialis anterior Normal None None None _______ Normal Normal Normal Normal  R. Tibialis posterior Normal None None None _______ Normal Normal Normal Normal  R. Vastus lateralis Normal None None None _______ Normal Normal Normal Normal  R. Gastrocnemius (Medial head) Normal None None None _______ Normal Normal Normal Normal  R. Gluteus medius Normal None None None _______ Normal Normal Normal Normal  R. Biceps femoris (long head) Normal None None None _______ Normal Normal Normal Normal  R. Biceps femoris (short head) Normal None None None _______ Normal Normal Normal Normal  L. Tibialis anterior Normal None None None _______ Normal Normal Normal Normal  L. Tibialis posterior Normal None None None _______ Normal Normal Normal Normal  L. Vastus lateralis Normal None None None _______ Normal Normal Normal Normal  L. Gastrocnemius (Medial head) Normal None None None _______ Normal Normal Normal Normal  L. Peroneus longus Normal None None None _______ Normal Normal Normal Normal
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
## 2024-02-24 ENCOUNTER — Telehealth: Payer: Self-pay | Admitting: Neurology

## 2024-02-24 NOTE — Telephone Encounter (Signed)
 [employer] no auth required case #[id number] sent to [facility] [phone]

## 2024-02-26 MED ORDER — DULOXETINE HCL 30 MG PO CPEP
30.0000 mg | ORAL_CAPSULE | Freq: Every day | ORAL | 0 refills | Status: DC
Start: 2024-02-26 — End: 2024-03-10

## 2024-02-26 NOTE — Telephone Encounter (Signed)
  This was patient's mychart message that came through in the scheduling messages.  Hey you were the only person I could find I'd had contact with DR Bluford did my NCV and had suggested trying cymbalta  and at the time I declined but I have thought more about it if there's even a possibility it could help I should take her up on it. Not sure if you can contact her or fwd my message to her but I'd like to try the cymbalta  if she's still willing to prescribe it the prescription would go to [facility] in my chart please let me know if you are able to help in this as I tried calling and it said send my chart message.

## 2024-02-28 ENCOUNTER — Ambulatory Visit (HOSPITAL_COMMUNITY)
Admission: RE | Admit: 2024-02-28 | Discharge: 2024-02-28 | Disposition: A | Discharge status: Home / Self Care | Source: Ambulatory Visit | Attending: Neurology | Admitting: Neurology

## 2024-02-28 DIAGNOSIS — M79604 Pain in right leg: Secondary | ICD-10-CM | POA: Diagnosis present

## 2024-02-28 DIAGNOSIS — M5417 Radiculopathy, lumbosacral region: Secondary | ICD-10-CM | POA: Diagnosis present

## 2024-02-28 DIAGNOSIS — M961 Postlaminectomy syndrome, not elsewhere classified: Secondary | ICD-10-CM | POA: Diagnosis present

## 2024-02-28 DIAGNOSIS — E1065 Type 1 diabetes mellitus with hyperglycemia: Secondary | ICD-10-CM | POA: Diagnosis present

## 2024-03-01 ENCOUNTER — Ambulatory Visit (HOSPITAL_COMMUNITY)
Admission: RE | Admit: 2024-03-01 | Discharge: 2024-03-01 | Disposition: A | Discharge status: Home / Self Care | Source: Ambulatory Visit | Attending: Neurology | Admitting: Neurology

## 2024-03-01 DIAGNOSIS — E1065 Type 1 diabetes mellitus with hyperglycemia: Secondary | ICD-10-CM | POA: Diagnosis present

## 2024-03-01 DIAGNOSIS — M5417 Radiculopathy, lumbosacral region: Secondary | ICD-10-CM | POA: Diagnosis present

## 2024-03-01 DIAGNOSIS — M79604 Pain in right leg: Secondary | ICD-10-CM | POA: Diagnosis present

## 2024-03-01 DIAGNOSIS — M961 Postlaminectomy syndrome, not elsewhere classified: Secondary | ICD-10-CM | POA: Diagnosis present

## 2024-03-03 ENCOUNTER — Ambulatory Visit (INDEPENDENT_AMBULATORY_CARE_PROVIDER_SITE_OTHER): Payer: Self-pay | Admitting: Neurology

## 2024-03-03 DIAGNOSIS — Z0289 Encounter for other administrative examinations: Secondary | ICD-10-CM | POA: Diagnosis not present

## 2024-03-10 ENCOUNTER — Encounter (HOSPITAL_COMMUNITY)

## 2024-03-10 ENCOUNTER — Ambulatory Visit (HOSPITAL_COMMUNITY)
Admission: RE | Admit: 2024-03-10 | Discharge: 2024-03-10 | Disposition: A | Discharge status: Home / Self Care | Source: Ambulatory Visit | Attending: Neurology | Admitting: Neurology

## 2024-03-10 ENCOUNTER — Encounter (HOSPITAL_COMMUNITY): Payer: Self-pay

## 2024-03-10 ENCOUNTER — Other Ambulatory Visit (HOSPITAL_COMMUNITY): Payer: Self-pay | Admitting: Neurology

## 2024-03-10 ENCOUNTER — Ambulatory Visit (HOSPITAL_BASED_OUTPATIENT_CLINIC_OR_DEPARTMENT_OTHER)
Admission: RE | Admit: 2024-03-10 | Discharge: 2024-03-10 | Disposition: A | Discharge status: Home / Self Care | Source: Ambulatory Visit | Attending: Surgery | Admitting: Surgery

## 2024-03-10 ENCOUNTER — Other Ambulatory Visit: Payer: Self-pay | Admitting: Nurse Practitioner

## 2024-03-10 DIAGNOSIS — E1065 Type 1 diabetes mellitus with hyperglycemia: Secondary | ICD-10-CM | POA: Insufficient documentation

## 2024-03-10 DIAGNOSIS — M5417 Radiculopathy, lumbosacral region: Secondary | ICD-10-CM | POA: Diagnosis present

## 2024-03-10 DIAGNOSIS — M79604 Pain in right leg: Secondary | ICD-10-CM | POA: Insufficient documentation

## 2024-03-10 DIAGNOSIS — M961 Postlaminectomy syndrome, not elsewhere classified: Secondary | ICD-10-CM | POA: Insufficient documentation

## 2024-03-10 MED ORDER — DULOXETINE HCL 30 MG PO CPEP: 60.0000 mg | ORAL_CAPSULE | Freq: Every day | ORAL | 11 refills | Status: AC

## 2024-03-10 NOTE — Telephone Encounter (Signed)
 Please see the MyChart message reply(ies) for my assessment and plan.    This patient gave consent for this Medical Advice Message and is aware that it may result in a bill to [employer], as well as the possibility of receiving a bill for a co-payment or deductible. They are an established patient, but are not seeking medical advice exclusively about a problem treated during an in person or video visit in the last seven days. I did not recommend an in person or video visit within seven days of my reply.    I spent a total of 7 minutes cumulative time within 7 days through [employer].  Modena Callander, MD    Meds ordered this encounter  Medications   DULoxetine  (CYMBALTA ) 30 MG capsule    Sig: Take 2 capsules (60 mg total) by mouth daily.    Dispense:  30 capsule    Refill:  11

## 2024-03-11 ENCOUNTER — Telehealth: Payer: Self-pay | Admitting: Neurology

## 2024-03-11 LAB — VAS US ABI WITH/WO TBI
Left ABI: 1.12
Right ABI: 1.16

## 2024-03-11 NOTE — Telephone Encounter (Signed)
 Called and spoke to pharmacy changed 15 day supply to 30 days since dose was increased but the quantity on the rx was not. Pharmacy was appreciative for the call and we were able to get it corrected.

## 2024-03-11 NOTE — Telephone Encounter (Signed)
 Meredith from  [employer] called to get Urgent clarification for Pt medication  Callback :[phone]   DULoxetine  (CYMBALTA ) 30 MG capsule

## 2024-03-12 ENCOUNTER — Ambulatory Visit: Payer: Self-pay | Admitting: Neurology

## 2024-04-16 ENCOUNTER — Encounter: Admitting: Neurology

## 2025-01-21 ENCOUNTER — Ambulatory Visit: Admitting: Nurse Practitioner
